# Patient Record
Sex: Female | Born: 1980 | Race: Black or African American | Hispanic: No | Marital: Married | Smoking: Never smoker
Health system: Southern US, Community
[De-identification: ages and names within clinical notes are randomized; demographics above are authoritative.]

## PROBLEM LIST (undated history)

## (undated) ENCOUNTER — Inpatient Hospital Stay (HOSPITAL_COMMUNITY): Payer: Self-pay

## (undated) DIAGNOSIS — I890 Lymphedema, not elsewhere classified: Secondary | ICD-10-CM

## (undated) DIAGNOSIS — O139 Gestational [pregnancy-induced] hypertension without significant proteinuria, unspecified trimester: Secondary | ICD-10-CM

## (undated) DIAGNOSIS — I10 Essential (primary) hypertension: Secondary | ICD-10-CM

## (undated) DIAGNOSIS — C50919 Malignant neoplasm of unspecified site of unspecified female breast: Secondary | ICD-10-CM

## (undated) HISTORY — PX: BREAST SURGERY: SHX581

---

## 2017-05-25 ENCOUNTER — Inpatient Hospital Stay (HOSPITAL_COMMUNITY)
Admission: AD | Admit: 2017-05-25 | Discharge: 2017-05-25 | Disposition: A | Discharge status: Home / Self Care | Payer: Self-pay | Source: Ambulatory Visit | Attending: Obstetrics & Gynecology | Admitting: Obstetrics & Gynecology

## 2017-05-25 ENCOUNTER — Other Ambulatory Visit: Payer: Self-pay

## 2017-05-25 ENCOUNTER — Encounter (HOSPITAL_COMMUNITY): Payer: Self-pay | Admitting: *Deleted

## 2017-05-25 DIAGNOSIS — O10013 Pre-existing essential hypertension complicating pregnancy, third trimester: Secondary | ICD-10-CM | POA: Insufficient documentation

## 2017-05-25 DIAGNOSIS — R42 Dizziness and giddiness: Secondary | ICD-10-CM | POA: Insufficient documentation

## 2017-05-25 DIAGNOSIS — O10913 Unspecified pre-existing hypertension complicating pregnancy, third trimester: Secondary | ICD-10-CM

## 2017-05-25 DIAGNOSIS — Z3A32 32 weeks gestation of pregnancy: Secondary | ICD-10-CM | POA: Insufficient documentation

## 2017-05-25 HISTORY — DX: Essential (primary) hypertension: I10

## 2017-05-25 HISTORY — DX: Malignant neoplasm of unspecified site of unspecified female breast: C50.919

## 2017-05-25 LAB — CBC WITH DIFFERENTIAL/PLATELET
BASOS ABS: 0 10*3/uL (ref 0.0–0.1)
BASOS PCT: 0 %
EOS ABS: 0.2 10*3/uL (ref 0.0–0.7)
Eosinophils Relative: 3 %
HEMATOCRIT: 31.7 % — AB (ref 36.0–46.0)
HEMOGLOBIN: 10.9 g/dL — AB (ref 12.0–15.0)
Lymphocytes Relative: 40 %
Lymphs Abs: 3.1 10*3/uL (ref 0.7–4.0)
MCH: 27.9 pg (ref 26.0–34.0)
MCHC: 34.4 g/dL (ref 30.0–36.0)
MCV: 81.3 fL (ref 78.0–100.0)
MONO ABS: 0.3 10*3/uL (ref 0.1–1.0)
Monocytes Relative: 4 %
NEUTROS ABS: 4 10*3/uL (ref 1.7–7.7)
NEUTROS PCT: 53 %
Platelets: 232 10*3/uL (ref 150–400)
RBC: 3.9 MIL/uL (ref 3.87–5.11)
RDW: 13.6 % (ref 11.5–15.5)
WBC: 7.6 10*3/uL (ref 4.0–10.5)

## 2017-05-25 LAB — COMPREHENSIVE METABOLIC PANEL
ALBUMIN: 3.3 g/dL — AB (ref 3.5–5.0)
ALT: 12 U/L — ABNORMAL LOW (ref 14–54)
ANION GAP: 10 (ref 5–15)
AST: 15 U/L (ref 15–41)
Alkaline Phosphatase: 88 U/L (ref 38–126)
BILIRUBIN TOTAL: 0.3 mg/dL (ref 0.3–1.2)
BUN: 6 mg/dL (ref 6–20)
CO2: 21 mmol/L — AB (ref 22–32)
Calcium: 8.9 mg/dL (ref 8.9–10.3)
Chloride: 102 mmol/L (ref 101–111)
Creatinine, Ser: 0.5 mg/dL (ref 0.44–1.00)
GFR calc Af Amer: >60 mL/min (ref >60)
GFR calc non Af Amer: >60 mL/min (ref >60)
GLUCOSE: 81 mg/dL (ref 65–99)
POTASSIUM: 3.7 mmol/L (ref 3.5–5.1)
SODIUM: 133 mmol/L — AB (ref 135–145)
TOTAL PROTEIN: 7 g/dL (ref 6.5–8.1)

## 2017-05-25 LAB — URINALYSIS, ROUTINE W REFLEX MICROSCOPIC
BILIRUBIN URINE: NEGATIVE
Glucose, UA: NEGATIVE mg/dL
Hgb urine dipstick: NEGATIVE
KETONES UR: NEGATIVE mg/dL
LEUKOCYTES UA: NEGATIVE
NITRITE: NEGATIVE
PROTEIN: NEGATIVE mg/dL
Specific Gravity, Urine: 1.009 (ref 1.005–1.030)
pH: 7 (ref 5.0–8.0)

## 2017-05-25 LAB — FETAL FIBRONECTIN: FETAL FIBRONECTIN: NEGATIVE

## 2017-05-25 LAB — WET PREP, GENITAL
CLUE CELLS WET PREP: NONE SEEN
Sperm: NONE SEEN
TRICH WET PREP: NONE SEEN
YEAST WET PREP: NONE SEEN

## 2017-05-25 LAB — PROTEIN / CREATININE RATIO, URINE
Creatinine, Urine: 59 mg/dL
Total Protein, Urine: <6 mg/dL

## 2017-05-25 MED ORDER — NIFEDIPINE 10 MG PO CAPS
20.0000 mg | ORAL_CAPSULE | Freq: Once | ORAL | Status: AC
  Administered 2017-05-25: 20 mg via ORAL
  Filled 2017-05-25: qty 2

## 2017-05-25 MED ORDER — LABETALOL HCL 100 MG PO TABS: 100.0000 mg | ORAL_TABLET | Freq: Two times a day (BID) | ORAL | 2 refills | Status: DC

## 2017-05-25 NOTE — MAU Provider Note (Signed)
History     CSN: [id number]  Arrival date and time: 05/25/17 1206   First Provider Initiated Contact with Patient 05/25/17 1332      Chief Complaint  Patient presents with  . Abdominal Pain  . Dizziness   HPI   [name] is a 36 y.o. female G3P1011 @ 28w4dhere in MAU with complaints of dizziness. [place]. States she woke up feeling dizzy today. The dizziness occurs when she is standing. She did eat breakfast: bread and butter and coffee. + fetal movement. +nausea, with vomiting. No vomiting today. Has chronic HTN and is taking aldomet. She started this in August after having an elevated BP in the office. States she doesn't take it everyday. She is supposed to take it three times per day. The last time she took her BP medication was 1 month ago.   OB History    Gravida Para Term Preterm AB Living   3 1 1   1 1    SAB TAB Ectopic Multiple Live Births           1      Past Medical History:  Diagnosis Date  . Breast cancer ([facility]     Past Surgical History:  Procedure Laterality Date  . BREAST SURGERY      History reviewed. No pertinent family history.  Social History   Tobacco Use  . Smoking status: Never Smoker  . Smokeless tobacco: Never Used  Substance Use Topics  . Alcohol use: No    Frequency: Never  . Drug use: No    Allergies: No Known Allergies  No medications prior to admission.   Results for orders placed or performed during the hospital encounter of 05/25/17 (from the past 48 hour(s))  Urinalysis, Routine w reflex microscopic     Status: Abnormal   Collection Time: 05/25/17 12:25 PM  Result Value Ref Range   Color, Urine STRAW (A) YELLOW   APPearance CLEAR CLEAR   Specific Gravity, Urine 1.009 1.005 - 1.030   pH 7.0 5.0 - 8.0   Glucose, UA NEGATIVE NEGATIVE mg/dL   Hgb urine dipstick NEGATIVE NEGATIVE   Bilirubin Urine NEGATIVE NEGATIVE   Ketones, ur NEGATIVE  NEGATIVE mg/dL   Protein, ur NEGATIVE NEGATIVE mg/dL   Nitrite NEGATIVE NEGATIVE   Leukocytes, UA NEGATIVE NEGATIVE  Protein / creatinine ratio, urine     Status: None   Collection Time: 05/25/17 12:25 PM  Result Value Ref Range   Creatinine, Urine 59.00 mg/dL   Total Protein, Urine <6 mg/dL    Comment: REPEATED TO VERIFY   Protein Creatinine Ratio        0.00 - 0.15 mg/mg[Cre]    Comment: RESULT BELOW REPORTABLE RANGE, UNABLE TO CALCULATE.   CBC with Differential/Platelet     Status: Abnormal   Collection Time: 05/25/17  1:15 PM  Result Value Ref Range   WBC 7.6 4.0 - 10.5 K/uL   RBC 3.90 3.87 - 5.11 MIL/uL   Hemoglobin 10.9 (L) 12.0 - 15.0 g/dL   HCT 31.7 (L) 36.0 - 46.0 %   MCV 81.3 78.0 - 100.0 fL   MCH 27.9 26.0 - 34.0 pg   MCHC 34.4 30.0 - 36.0 g/dL   RDW 13.6 11.5 - 15.5 %   Platelets 232 150 - 400 K/uL   Neutrophils Relative % 53 %   Neutro Abs 4.0 1.7 - 7.7 K/uL   Lymphocytes Relative 40 %  Lymphs Abs 3.1 0.7 - 4.0 K/uL   Monocytes Relative 4 %   Monocytes Absolute 0.3 0.1 - 1.0 K/uL   Eosinophils Relative 3 %   Eosinophils Absolute 0.2 0.0 - 0.7 K/uL   Basophils Relative 0 %   Basophils Absolute 0.0 0.0 - 0.1 K/uL  Comprehensive metabolic panel     Status: Abnormal   Collection Time: 05/25/17  1:15 PM  Result Value Ref Range   Sodium 133 (L) 135 - 145 mmol/L   Potassium 3.7 3.5 - 5.1 mmol/L   Chloride 102 101 - 111 mmol/L   CO2 21 (L) 22 - 32 mmol/L   Glucose, Bld 81 65 - 99 mg/dL   BUN 6 6 - 20 mg/dL   Creatinine, Ser 0.50 0.44 - 1.00 mg/dL   Calcium 8.9 8.9 - 10.3 mg/dL   Total Protein 7.0 6.5 - 8.1 g/dL   Albumin 3.3 (L) 3.5 - 5.0 g/dL   AST 15 15 - 41 U/L   ALT 12 (L) 14 - 54 U/L   Alkaline Phosphatase 88 38 - 126 U/L   Total Bilirubin 0.3 0.3 - 1.2 mg/dL   GFR calc non Af Amer >60 >60 mL/min   GFR calc Af Amer >60 >60 mL/min    Comment: (NOTE) The eGFR has been calculated using the CKD EPI equation. This calculation has not been validated in  all clinical situations. eGFR's persistently <60 mL/min signify possible Chronic Kidney Disease.    Anion gap 10 5 - 15  Fetal fibronectin     Status: None   Collection Time: 05/25/17  2:07 PM  Result Value Ref Range   Fetal Fibronectin NEGATIVE NEGATIVE  Wet prep, genital     Status: Abnormal   Collection Time: 05/25/17  2:07 PM  Result Value Ref Range   Yeast Wet Prep HPF POC NONE SEEN NONE SEEN   Trich, Wet Prep NONE SEEN NONE SEEN   Clue Cells Wet Prep HPF POC NONE SEEN NONE SEEN   WBC, Wet Prep HPF POC FEW (A) NONE SEEN    Comment: MANY BACTERIA SEEN   Sperm NONE SEEN    Review of Systems  Eyes: Negative for photophobia and visual disturbance.  Neurological: Negative for headaches.   Physical Exam   Blood pressure 136/70, pulse 90, temperature 98.3 F (36.8 C), temperature source Oral, resp. rate 16, weight 213 lb 8 oz (96.8 kg), last menstrual period 10/09/2016, SpO2 99 %.  Physical Exam  Constitutional: She is oriented to person, place, and time. She appears well-developed and well-nourished. No distress.  HENT:  Head: Normocephalic.  Eyes: Pupils are equal, round, and reactive to light.  Respiratory: Effort normal.  GI: Soft. She exhibits no distension. There is no tenderness. There is no rebound.  Genitourinary:  Genitourinary Comments: Dilation: 1 Effacement (%): 50 Exam by:: Noni Saupe, NP  Musculoskeletal: Normal range of motion.  Neurological: She is alert and oriented to person, place, and time. She has normal reflexes.  Negative clonus  Skin: Skin is warm. She is not diaphoretic.  Psychiatric: Her behavior is normal.   Fetal Tracing: Baseline: 125 bpm Variability: Moderate  Accelerations: 15x15 Decelerations: None Toco: occasional   MAU Course  Procedures  None  MDM  Procardia 20 mg given x 1 Discussed with Dr. Ernestina Patches.  Orthostatic vitals normal   Assessment and Plan   A:  1. Maternal chronic hypertension in third trimester   2.  Dizzinesses     P:  Discharge home with  strict return precautions RX: Labetalol 100 mg BID  Return to the Office on Friday for a BP check Preeclampsia precautions  Return to MAU if symptoms worsen  Message sent to the office to schedule New OB appointment Increase oral fluid Small, frequent meals   Rasch, Artist Pais, NP 05/25/2017 7:51 PM

## 2017-05-25 NOTE — Discharge Instructions (Signed)
For assistance with applying for medicaid please call Reyna [facility] [phone] or Juanita McCraw [phone].  

## 2017-05-25 NOTE — MAU Note (Signed)
Feeling dizzy, (sometimes).  Has a big pain in her abd, RUQ and lower abd.  Arrived [place] the states 12/7, was getting care in [place], last seen 12/1

## 2017-05-26 LAB — GC/CHLAMYDIA PROBE AMP (~~LOC~~) NOT AT ARMC
Chlamydia: NEGATIVE
Neisseria Gonorrhea: NEGATIVE

## 2017-05-27 ENCOUNTER — Inpatient Hospital Stay (HOSPITAL_COMMUNITY)
Admission: AD | Admit: 2017-05-27 | Discharge: 2017-05-27 | Disposition: A | Discharge status: Home / Self Care | Payer: Self-pay | Source: Ambulatory Visit | Attending: Obstetrics & Gynecology | Admitting: Obstetrics & Gynecology

## 2017-05-27 ENCOUNTER — Ambulatory Visit: Payer: Self-pay

## 2017-05-27 ENCOUNTER — Other Ambulatory Visit: Payer: Self-pay

## 2017-05-27 DIAGNOSIS — Z79899 Other long term (current) drug therapy: Secondary | ICD-10-CM | POA: Insufficient documentation

## 2017-05-27 DIAGNOSIS — Z3A32 32 weeks gestation of pregnancy: Secondary | ICD-10-CM | POA: Insufficient documentation

## 2017-05-27 DIAGNOSIS — O10919 Unspecified pre-existing hypertension complicating pregnancy, unspecified trimester: Secondary | ICD-10-CM

## 2017-05-27 DIAGNOSIS — O10913 Unspecified pre-existing hypertension complicating pregnancy, third trimester: Secondary | ICD-10-CM

## 2017-05-27 DIAGNOSIS — Z0130 Encounter for examination of blood pressure without abnormal findings: Secondary | ICD-10-CM

## 2017-05-27 DIAGNOSIS — O163 Unspecified maternal hypertension, third trimester: Secondary | ICD-10-CM | POA: Insufficient documentation

## 2017-05-27 MED ORDER — LABETALOL HCL 200 MG PO TABS: 200.0000 mg | ORAL_TABLET | Freq: Three times a day (TID) | ORAL | 1 refills | Status: DC

## 2017-05-27 NOTE — MAU Note (Addendum)
Pt here for BP check, states she started taking labetalol two days ago.  Pt states she has some lower abd pain which has been ongoing. Denies HA or visual changes.  Denies bleeding or LOF.  Reports good fetal movement.

## 2017-05-27 NOTE — MAU Provider Note (Signed)
[name] is a 36 y.o. female G35P1011 @ [redacted]w[redacted]d with CHTN,  here in MAU for a BP check. She was seen in MAU on Wednesday.  She was started on labetalol 100 mg BID on Wednesday and says she has been taking it at prescribed. She denies HA or changes in her vision. She was scheduled to be seen in the [place] this morning however was not able to make it.  + fetal movement.   Patient Vitals for the past 24 hrs:  BP Temp Temp src Pulse Resp  05/27/17 1754 (!) 147/95 - - (!) 103 20  05/27/17 1731 (!) 144/84 98.3 F (36.8 C) Oral (!) 105 18    MDM:  Discussed BP readings with Dr. Elonda Husky, patient is without complaints.. Pre-E labs on 12/19 WNL   A:  BP check  Chronic hypertension during pregnancy, antepartum   P:  Discharge home with strict return precautions Increase Labetalol to 200 mg TID Return to MAU on Monday for a BP check, office is closed Preeclampsia precautions    Norman Piacentini, Artist Pais, NP 05/27/2017 6:59 PM

## 2017-05-27 NOTE — Discharge Instructions (Signed)

## 2017-05-31 ENCOUNTER — Inpatient Hospital Stay (HOSPITAL_COMMUNITY)
Admission: AD | Admit: 2017-05-31 | Discharge: 2017-05-31 | Disposition: A | Discharge status: Home / Self Care | Payer: Self-pay | Source: Ambulatory Visit | Attending: Family Medicine | Admitting: Family Medicine

## 2017-05-31 DIAGNOSIS — Z79899 Other long term (current) drug therapy: Secondary | ICD-10-CM | POA: Insufficient documentation

## 2017-05-31 DIAGNOSIS — O169 Unspecified maternal hypertension, unspecified trimester: Secondary | ICD-10-CM

## 2017-05-31 DIAGNOSIS — Z0130 Encounter for examination of blood pressure without abnormal findings: Secondary | ICD-10-CM | POA: Insufficient documentation

## 2017-05-31 DIAGNOSIS — O163 Unspecified maternal hypertension, third trimester: Secondary | ICD-10-CM

## 2017-05-31 NOTE — MAU Provider Note (Signed)
Whitney Morrison is a 36 y.o. G3P1011 @ [redacted]w[redacted]d gestation who present to the MAU for BP check after BP medication adjustment a few days ago. Patient denies headache, visual changes, abdominal pain or any problems.  BP 130/78   Pulse 97   Temp 98.4 F (36.9 C)   Resp 18   Wt 214 lb (97.1 kg)   LMP 10/09/2016   SpO2 100%   Positive doppler FHT's.  Patient to continue Labetalol and f/u as scheduled in the [facility]. Return precautions discussed with the patient.  Patient appears stable for d/c with normal BP. Medical screening exam complete.

## 2017-05-31 NOTE — MAU Note (Signed)
Pt here for BP check. Was seen in MAU and told to come Monday for recheck.

## 2017-05-31 NOTE — Discharge Instructions (Signed)
Hypertension During Pregnancy °Hypertension, commonly called high blood pressure, is when the force of blood pumping through your arteries is too strong. Arteries are blood vessels that carry blood from the heart throughout the body. Hypertension during pregnancy can cause problems for you and your baby. Your baby may be born early (prematurely) or may not weigh as much as he or she should at birth. Very bad cases of hypertension during pregnancy can be life-threatening. °Different types of hypertension can occur during pregnancy. These include: °· Chronic hypertension. This happens when: °? You have hypertension before pregnancy and it continues during pregnancy. °? You develop hypertension before you are [redacted] weeks pregnant, and it continues during pregnancy. °· Gestational hypertension. This is hypertension that develops after the 20th week of pregnancy. °· Preeclampsia, also called toxemia of pregnancy. This is a very serious type of hypertension that develops only during pregnancy. It affects the whole body, and it can be very dangerous for you and your baby. ° °Gestational hypertension and preeclampsia usually go away within 6 weeks after your baby is born. Women who have hypertension during pregnancy have a greater chance of developing hypertension later in life or during future pregnancies. °What are the causes? °The exact cause of hypertension is not known. °What increases the risk? °There are certain factors that make it more likely for you to develop hypertension during pregnancy. These include: °· Having hypertension during a previous pregnancy or prior to pregnancy. °· Being overweight. °· Being older than age 40. °· Being pregnant for the first time or being pregnant with more than one baby. °· Becoming pregnant using fertilization methods such as IVF (in vitro fertilization). °· Having diabetes, kidney problems, or systemic lupus erythematosus. °· Having a family history of hypertension. ° °What are the  signs or symptoms? °Chronic hypertension and gestational hypertension rarely cause symptoms. Preeclampsia causes symptoms, which may include: °· Increased protein in your urine. Your health care provider will check for this at every visit before you give birth (prenatal visit). °· Severe headaches. °· Sudden weight gain. °· Swelling of the hands, face, legs, and feet. °· Nausea and vomiting. °· Vision problems, such as blurred or double vision. °· Numbness in the face, arms, legs, and feet. °· Dizziness. °· Slurred speech. °· Sensitivity to bright lights. °· Abdominal pain. °· Convulsions. ° °How is this diagnosed? °You may be diagnosed with hypertension during a routine prenatal exam. At each prenatal visit, you may: °· Have a urine test to check for high amounts of protein in your urine. °· Have your blood pressure checked. A blood pressure reading is recorded as two numbers, such as "120 over 80" (or 120/80). The first ("top") number is called the systolic pressure. It is a measure of the pressure in your arteries when your heart beats. The second ("bottom") number is called the diastolic pressure. It is a measure of the pressure in your arteries as your heart relaxes between beats. Blood pressure is measured in a unit called mm Hg. A normal blood pressure reading is: °? Systolic: below 120. °? Diastolic: below 80. ° °The type of hypertension that you are diagnosed with depends on your test results and when your symptoms developed. °· Chronic hypertension is usually diagnosed before 20 weeks of pregnancy. °· Gestational hypertension is usually diagnosed after 20 weeks of pregnancy. °· Hypertension with high amounts of protein in the urine is diagnosed as preeclampsia. °· Blood pressure measurements that stay above 160 systolic, or above 110 diastolic, are   signs of severe preeclampsia. ° °How is this treated? °Treatment for hypertension during pregnancy varies depending on the type of hypertension you have and how  serious it is. °· If you take medicines called ACE inhibitors to treat chronic hypertension, you may need to switch medicines. ACE inhibitors should not be taken during pregnancy. °· If you have gestational hypertension, you may need to take blood pressure medicine. °· If you are at risk for preeclampsia, your health care provider may recommend that you take a low-dose aspirin every day to prevent high blood pressure during your pregnancy. °· If you have severe preeclampsia, you may need to be hospitalized so you and your baby can be monitored closely. You may also need to take medicine (magnesium sulfate) to prevent seizures and to lower blood pressure. This medicine may be given as an injection or through an IV tube. °· In some cases, if your condition gets worse, you may need to deliver your baby early. ° °Follow these instructions at home: °Eating and drinking °· Drink enough fluid to keep your urine clear or pale yellow. °· Eat a healthy diet that is low in salt (sodium). Do not add salt to your food. Check food labels to see how much sodium a food or beverage contains. °Lifestyle °· Do not use any products that contain nicotine or tobacco, such as cigarettes and e-cigarettes. If you need help quitting, ask your health care provider. °· Do not use alcohol. °· Avoid caffeine. °· Avoid stress as much as possible. Rest and get plenty of sleep. °General instructions °· Take over-the-counter and prescription medicines only as told by your health care provider. °· While lying down, lie on your left side. This keeps pressure off your baby. °· While sitting or lying down, raise (elevate) your feet. Try putting some pillows under your lower legs. °· Exercise regularly. Ask your health care provider what kinds of exercise are best for you. °· Keep all prenatal and follow-up visits as told by your health care provider. This is important. °Contact a health care provider if: °· You have symptoms that your health care  provider told you may require more treatment or monitoring, such as: °? Fever. °? Vomiting. °? Headache. °Get help right away if: °· You have severe abdominal pain or vomiting that does not get better with treatment. °· You suddenly develop swelling in your hands, ankles, or face. °· You gain 4 lbs (1.8 kg) or more in 1 week. °· You develop vaginal bleeding, or you have blood in your urine. °· You do not feel your baby moving as much as usual. °· You have blurred or double vision. °· You have muscle twitching or sudden tightening (spasms). °· You have shortness of breath. °· Your lips or fingernails turn blue. °This information is not intended to replace advice given to you by your health care provider. Make sure you discuss any questions you have with your health care provider. °Document Released: 02/09/2011 Document Revised: 12/12/2015 Document Reviewed: 11/07/2015 °Elsevier Interactive Patient Education © 2018 [employer] ° °

## 2017-06-07 NOTE — L&D Delivery Note (Signed)
Delivery Note At 9:47 AM a viable female was delivered via Vaginal, Spontaneous (Presentation:vertex ;LOA  ).  APGAR: 9, 9; weight pending    Placenta status:delivered intact  , .  Cord:3 V with marginal insertion not previously noted   with the following complications:none .  Cord pH: N/A  Anesthesia:  epidural Episiotomy: None Lacerations:  none Suture Repair: none Est. Blood Loss (mL):  0  Mom to postpartum.  Baby to Couplet care / Skin to Skin.  Normalee Sistare 07/11/2017, 10:03 AM

## 2017-06-14 ENCOUNTER — Encounter (HOSPITAL_COMMUNITY): Payer: Self-pay | Admitting: *Deleted

## 2017-06-14 ENCOUNTER — Encounter (HOSPITAL_COMMUNITY): Payer: Self-pay | Admitting: Student

## 2017-06-14 ENCOUNTER — Other Ambulatory Visit: Payer: Self-pay

## 2017-06-14 ENCOUNTER — Encounter: Payer: Self-pay | Admitting: Obstetrics and Gynecology

## 2017-06-14 ENCOUNTER — Inpatient Hospital Stay (HOSPITAL_COMMUNITY)
Admission: AD | Admit: 2017-06-14 | Discharge: 2017-06-14 | Disposition: A | Discharge status: Home / Self Care | Payer: Self-pay | Source: Ambulatory Visit | Attending: Obstetrics and Gynecology | Admitting: Obstetrics and Gynecology

## 2017-06-14 DIAGNOSIS — Z853 Personal history of malignant neoplasm of breast: Secondary | ICD-10-CM | POA: Insufficient documentation

## 2017-06-14 DIAGNOSIS — Z833 Family history of diabetes mellitus: Secondary | ICD-10-CM | POA: Insufficient documentation

## 2017-06-14 DIAGNOSIS — O09523 Supervision of elderly multigravida, third trimester: Secondary | ICD-10-CM | POA: Insufficient documentation

## 2017-06-14 DIAGNOSIS — Z363 Encounter for antenatal screening for malformations: Secondary | ICD-10-CM

## 2017-06-14 DIAGNOSIS — O10013 Pre-existing essential hypertension complicating pregnancy, third trimester: Secondary | ICD-10-CM | POA: Insufficient documentation

## 2017-06-14 DIAGNOSIS — O10913 Unspecified pre-existing hypertension complicating pregnancy, third trimester: Secondary | ICD-10-CM

## 2017-06-14 DIAGNOSIS — O0933 Supervision of pregnancy with insufficient antenatal care, third trimester: Secondary | ICD-10-CM | POA: Insufficient documentation

## 2017-06-14 DIAGNOSIS — Z3A35 35 weeks gestation of pregnancy: Secondary | ICD-10-CM | POA: Insufficient documentation

## 2017-06-14 HISTORY — DX: Gestational (pregnancy-induced) hypertension without significant proteinuria, unspecified trimester: O13.9

## 2017-06-14 LAB — URINALYSIS, ROUTINE W REFLEX MICROSCOPIC
BILIRUBIN URINE: NEGATIVE
Glucose, UA: NEGATIVE mg/dL
HGB URINE DIPSTICK: NEGATIVE
Ketones, ur: NEGATIVE mg/dL
Leukocytes, UA: NEGATIVE
Nitrite: NEGATIVE
Protein, ur: NEGATIVE mg/dL
SPECIFIC GRAVITY, URINE: 1.023 (ref 1.005–1.030)
pH: 5 (ref 5.0–8.0)

## 2017-06-14 NOTE — Discharge Instructions (Signed)

## 2017-06-14 NOTE — MAU Note (Signed)
Pt presents with c/o dizziness that began a few minutes ago, lower back pain.  Pt states had appt todau in clinic, not seen d/t late for appt.  Pt reports hx of ^BP with this pregnacy, taking Labetalol 400mg  tid.  Wants to be seen.
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
## 2017-06-14 NOTE — MAU Provider Note (Signed)
History     CSN: [id number]  Arrival date and time: 06/14/17 1112   First Provider Initiated Contact with Patient 06/14/17 1236      Chief Complaint  Patient presents with  . Hypertension   HPI Whitney Morrison is a 37 y.o. G3P1101 at [redacted]w[redacted]d who presents for evaluation. Patient has not been able to keep initial OB appointment & has not had any prenatal care with this pregnancy. Patient with history of chronic hypertension & is currently taking labetalol 200 mg BID. Was rescheduled for her OB appointment this morning but arrived late & was unable to be seen. Currently denies headache, visual disturbance, epigastric pain, abdominal pain, or vaginal bleeding. Positive fetal movement.   Past Medical History:  Diagnosis Date  . Breast cancer ([facility])   . Hypertension   . Pregnancy induced hypertension    during her 2005 pregnancy and ever since    Past Surgical History:  Procedure Laterality Date  . BREAST SURGERY      Family History  Problem Relation Age of Onset  . Diabetes Father   . Hypertension Maternal Grandmother     Social History   Tobacco Use  . Smoking status: Never Smoker  . Smokeless tobacco: Never Used  Substance Use Topics  . Alcohol use: No    Frequency: Never  . Drug use: No    Allergies: No Known Allergies  Medications Prior to Admission  Medication Sig Dispense Refill Last Dose  . acetaminophen (TYLENOL) 325 MG tablet Take 650 mg by mouth every 6 (six) hours as needed for mild pain or headache.   Past Month at Unknown time  . calcium carbonate (TUMS - DOSED IN MG ELEMENTAL CALCIUM) 500 MG chewable tablet Chew 1 tablet by mouth daily as needed for indigestion or heartburn.   06/13/2017 at Unknown time  . labetalol (NORMODYNE) 200 MG tablet Take 1 tablet (200 mg total) by mouth 3 (three) times daily. 180 tablet 1 06/14/2017 at 1000  . prenatal vitamin w/FE, FA (PRENATAL 1 + 1) 27-1 MG TABS tablet Take 1 tablet by mouth daily at 12 noon.   Past Week at Unknown time     Review of Systems  Constitutional: Negative.   Gastrointestinal: Negative.   Genitourinary: Negative.    Physical Exam   Blood pressure 106/75, pulse 89, temperature 97.7 F (36.5 C), temperature source Oral, resp. rate 18, height 5' 5.75" (1.67 m), weight 215 lb 4 oz (97.6 kg), last menstrual period 10/09/2016, SpO2 100 %.  Physical Exam  Nursing note and vitals reviewed. Constitutional: She is oriented to person, place, and time. She appears well-developed and well-nourished. No distress.  HENT:  Head: Normocephalic and atraumatic.  Eyes: Conjunctivae are normal. Right eye exhibits no discharge. Left eye exhibits no discharge. No scleral icterus.  Neck: Normal range of motion.  Cardiovascular: Normal rate, regular rhythm and normal heart sounds.  No murmur heard. Respiratory: Effort normal and breath sounds normal. No respiratory distress. She has no wheezes.  GI: Soft. There is no tenderness.  Neurological: She is alert and oriented to person, place, and time.  Skin: Skin is warm and dry. She is not diaphoretic.  Psychiatric: She has a normal mood and affect. Her behavior is normal. Judgment and thought content normal.    MAU Course  Procedures No results found for this or any previous visit (from the past 24 hour(s)).   MDM NST:  Baseline: 135 bpm, Variability: Good {> 6 bpm), Accelerations: Reactive and Decelerations: Absent  Pt normotensive & without complaint. Has appointment scheduled next week. Will schedule anatomy scan today so will hopefully have results for next visit. Stressed importance of keeping scheduled appointments & need to additional testing in pregnancy d/t chronic hypertension.   Assessment and Plan  A: 1. Maternal chronic hypertension in third trimester   2. [redacted] weeks gestation of pregnancy   3. Encounter for antenatal screening for malformation using ultrasound   4. Limited prenatal care in third trimester    P: Discharge home Keep scheduled  appointments Anatomy ultrasound scheduled for this Friday Discussed reasons to return to [place] 06/14/2017, 12:36 PM

## 2017-06-17 ENCOUNTER — Encounter (HOSPITAL_COMMUNITY): Payer: Self-pay

## 2017-06-17 ENCOUNTER — Ambulatory Visit (HOSPITAL_COMMUNITY)
Admission: RE | Admit: 2017-06-17 | Discharge: 2017-06-17 | Disposition: A | Discharge status: Home / Self Care | Payer: Self-pay | Source: Ambulatory Visit | Attending: Student | Admitting: Student

## 2017-06-17 ENCOUNTER — Other Ambulatory Visit (HOSPITAL_COMMUNITY): Payer: Self-pay | Admitting: Student

## 2017-06-17 DIAGNOSIS — O09523 Supervision of elderly multigravida, third trimester: Secondary | ICD-10-CM | POA: Insufficient documentation

## 2017-06-17 DIAGNOSIS — O09299 Supervision of pregnancy with other poor reproductive or obstetric history, unspecified trimester: Secondary | ICD-10-CM

## 2017-06-17 DIAGNOSIS — Z3A35 35 weeks gestation of pregnancy: Secondary | ICD-10-CM

## 2017-06-17 DIAGNOSIS — O0933 Supervision of pregnancy with insufficient antenatal care, third trimester: Secondary | ICD-10-CM

## 2017-06-17 DIAGNOSIS — Z363 Encounter for antenatal screening for malformations: Secondary | ICD-10-CM | POA: Insufficient documentation

## 2017-06-17 DIAGNOSIS — Z3689 Encounter for other specified antenatal screening: Secondary | ICD-10-CM

## 2017-06-17 DIAGNOSIS — O10913 Unspecified pre-existing hypertension complicating pregnancy, third trimester: Secondary | ICD-10-CM | POA: Insufficient documentation

## 2017-06-17 DIAGNOSIS — Z3A33 33 weeks gestation of pregnancy: Secondary | ICD-10-CM | POA: Insufficient documentation

## 2017-06-17 HISTORY — DX: Lymphedema, not elsewhere classified: I89.0

## 2017-06-17 IMAGING — US US MFM FETAL BPP W/O NON-STRESS
1 series · 13 of 28 positions shown · non-contrast
Comparison: none

[Series 1: us mfm fetal bpp w/o non-stress · 67 acquisitions, 13 frames shown]
[im 3/67]
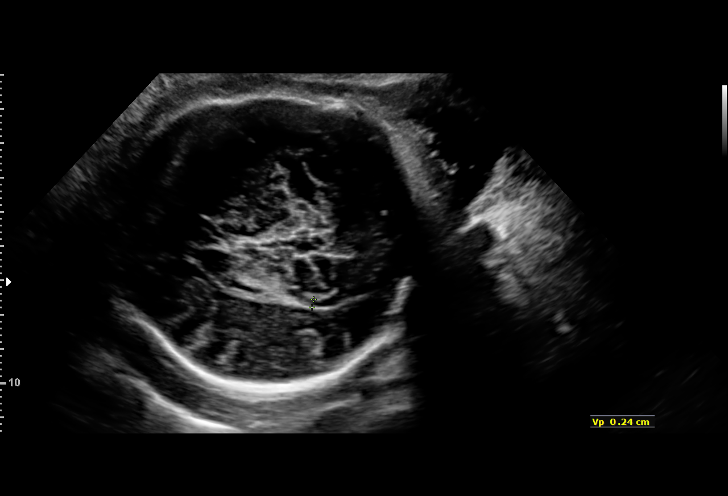
[im 8/67]
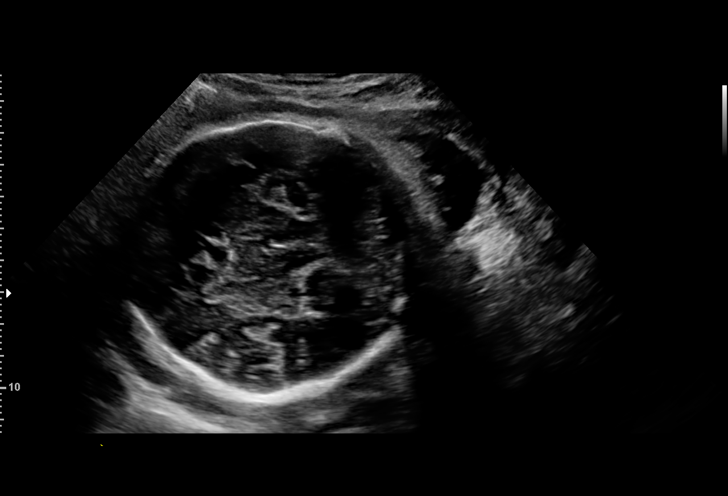
[im 13/67]
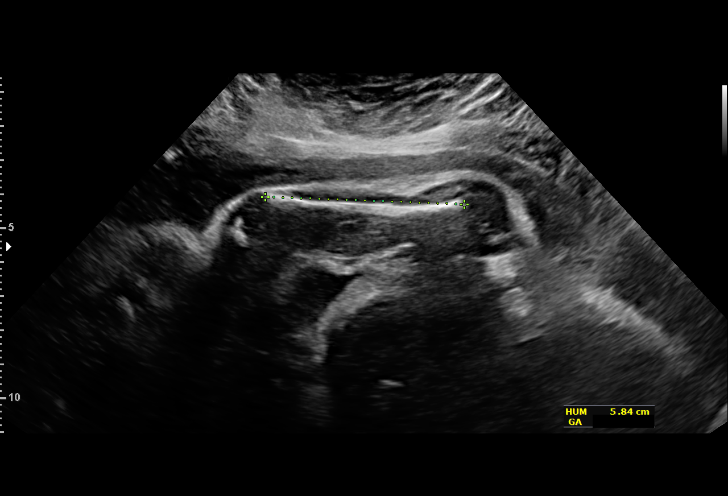
[im 18/67]
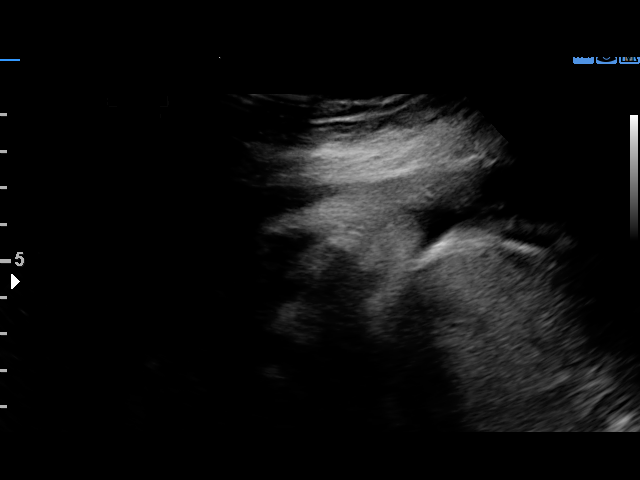
[im 23/67]
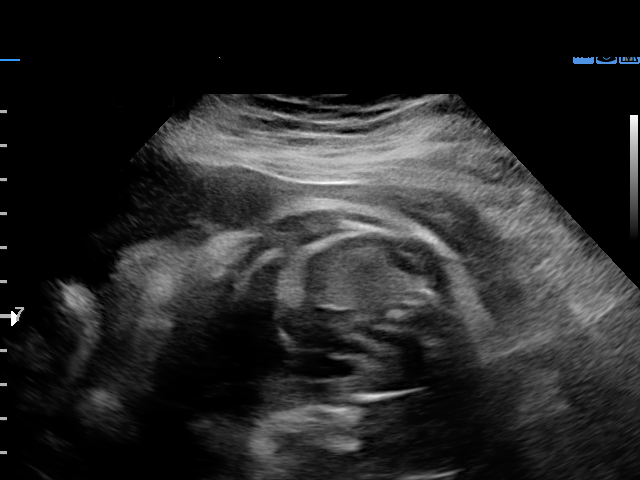
[im 27/67]
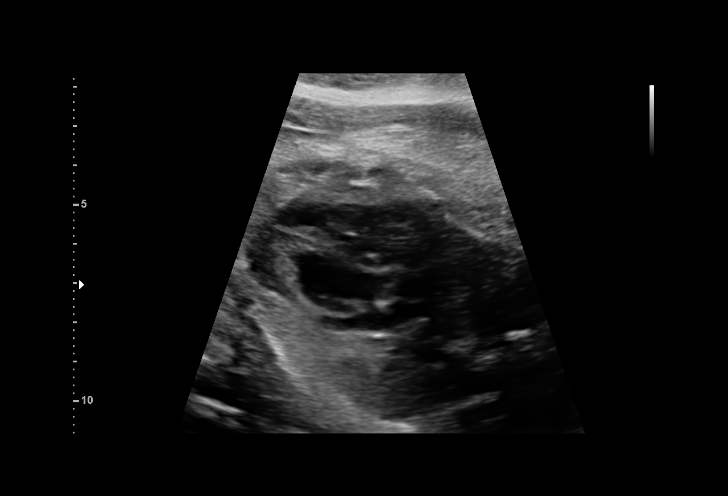
[im 35/67]
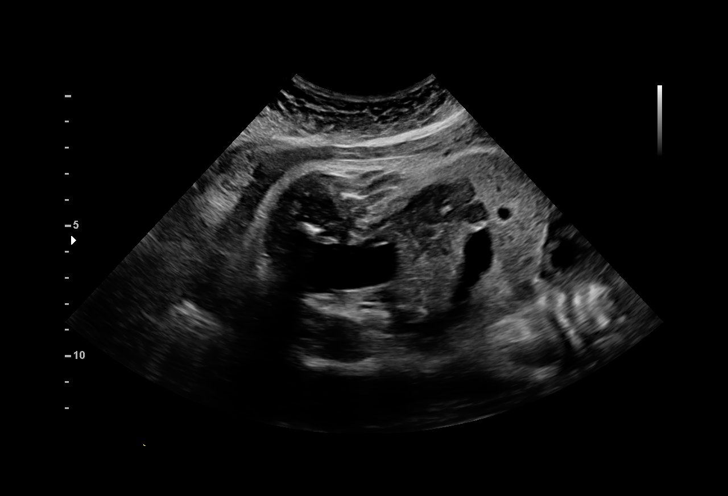
[im 40/67]
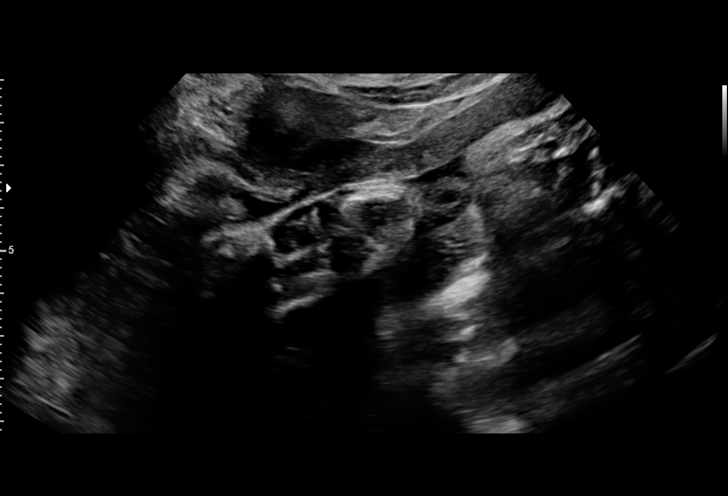
[im 45/67]
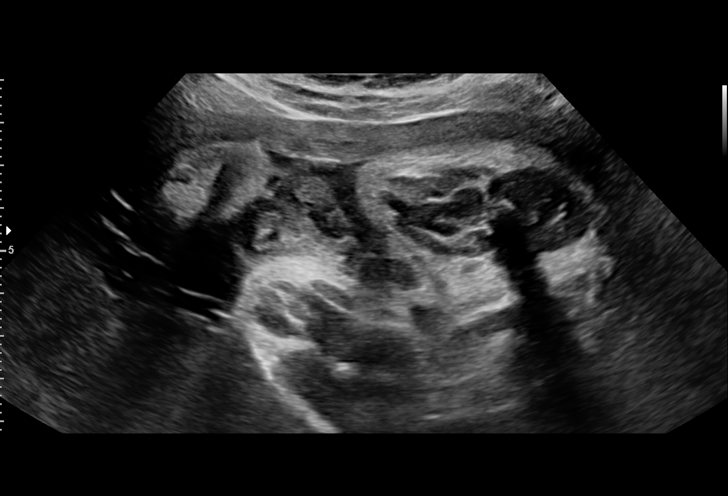
[im 49/67]
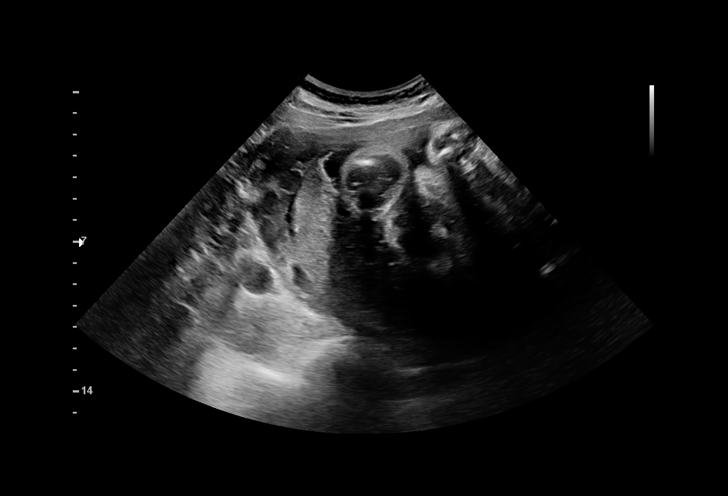
[im 54/67]
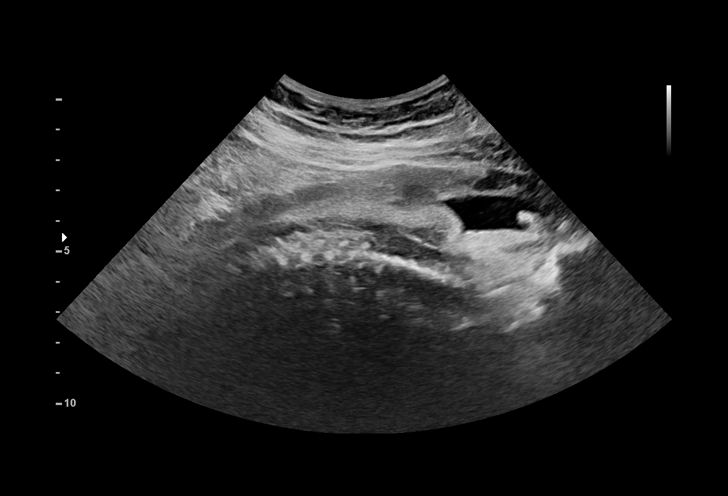
[im 59/67]
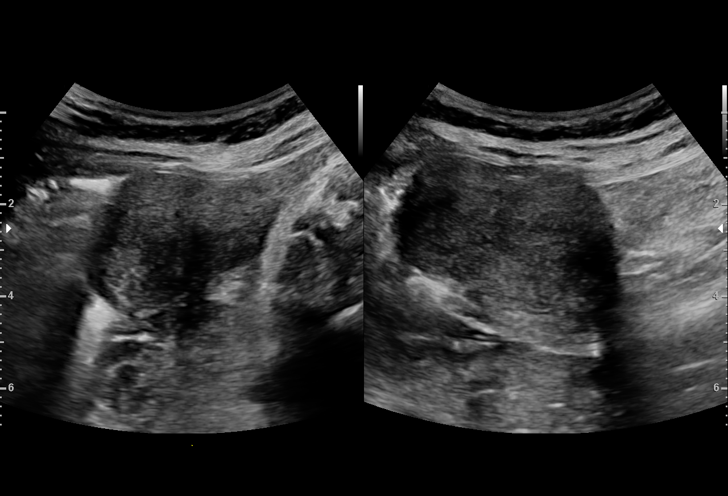
[im 64/67]
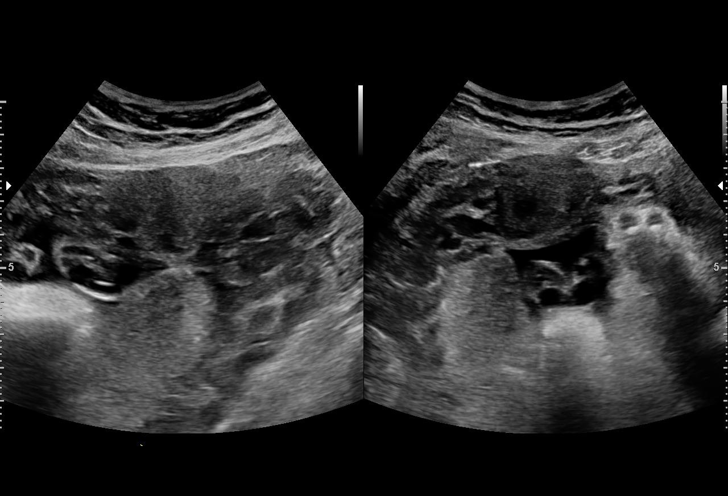

[13 of 28 positions shown; findings below may reference images not displayed]

OB/Gyn Clinic
[REDACTED]

Indications

33 weeks gestation of pregnancy
Advanced maternal age multigravida 35+,        [TE]
third trimester
Hypertension - Chronic/Pre-existing            [TE]
(labetalol)
Poor obstetric history: Previous IUFD          [TE]
(stillbirth)
Late to prenatal care, third trimester         [TE]
Encounter for fetal anatomic survey            [TE]
OB History

Gravidity:    3         Term:   1        Prem:   1
Living:       1
Fetal Evaluation

Num Of Fetuses:     1
Fetal Heart         146
Rate(bpm):
Cardiac Activity:   Observed
Presentation:       Cephalic
Placenta:           Posterior, above cervical os
P. Cord Insertion:  Not well visualized
Amniotic Fluid
AFI FV:      Subjectively within normal limits

AFI Sum(cm)     %Tile       Largest Pocket(cm)
14.38           50

RUQ(cm)       RLQ(cm)       LUQ(cm)        LLQ(cm)
4.58
Biophysical Evaluation

Amniotic F.V:   Pocket => 2 cm two         F. Tone:        Observed
planes
F. Movement:    Observed                   Score:          [DATE]
F. Breathing:   Observed
Biometry

BPD:        83  mm     G. Age:  33w 3d         48  %    CI:        80.29   %    70 - 86
FL/HC:      22.4   %    19.9 -
HC:      292.6  mm     G. Age:  32w 2d          4  %    HC/AC:      0.99        0.96 -
AC:      294.6  mm     G. Age:  33w 3d         56  %    FL/BPD:     78.8   %    71 - 87
FL:       65.4  mm     G. Age:  33w 5d         50  %    FL/AC:      22.2   %    20 - 24
HUM:        58  mm     G. Age:  33w 4d         66  %

Est. FW:    [TE]  gm    4 lb 13 oz      60  %
Gestational Age

LMP:           35w 6d        Date:  [DATE]                 EDD:   [DATE]
U/S Today:     33w 2d                                        EDD:   [DATE]
Best:          33w 2d     Det. By:  U/S ([DATE])           EDD:   [DATE]
Anatomy

Cranium:               Appears normal         Aortic Arch:            Not well visualized
Cavum:                 Not well visualized    Ductal Arch:            Not well visualized
Ventricles:            Appears normal         Diaphragm:              Appears normal
Choroid Plexus:        Not well visualized    Stomach:                Appears normal, left
sided
Cerebellum:            Not well visualized    Abdomen:                Appears normal
Posterior Fossa:       Not well visualized    Abdominal Wall:         Not well visualized
Nuchal Fold:           Not applicable (>20    Cord Vessels:           Appears normal (3
wks GA)                                        vessel cord)
Face:                  Not well visualized    Kidneys:                Appear normal
Lips:                  Appears normal         Bladder:                Appears normal
Thoracic:              Appears normal         Spine:                  Not well visualized
Heart:                 Appears normal         Upper Extremities:      Present
(4CH, axis, and situs
RVOT:                  Appears normal         Lower Extremities:      Present
LVOT:                  Appears normal

Other:  Fetus appears to be a male. Technically difficult due to advanced
gestational age, fetal position and multiple uterine fibroids.
Cervix Uterus Adnexa

Cervix
Not visualized (advanced GA >[TE])
Uterus
Multiple (TNTC) fibroids noted, see table below for largest.

Left Ovary
Not visualized.

Right Ovary
Not visualized.
Myomas

Site                     L(cm)      W(cm)      D(cm)      Location
Anterior mid             3.2        3.7        2.2        Intramural
Fundus right             2.6        4.5        3.2        Subserosal
Right anterior LUS       4.5        2.9        2.1        Intramural

Blood Flow                 RI        PI       Comments

Impression

Single living intrauterine pregnancy at 33w 2d.
Normal detailed fetal anatomy; limited views of CSP, PF,
arches, CI and spine
Normal amniotic fluid volume
EDC based on today's measurements: [DATE]
BPP [DATE]
Recommendations

Continue antenatal testing.
Recommend delivery by 39 weeks if maternal and fetal status
remain reassuring.

## 2017-06-21 ENCOUNTER — Ambulatory Visit (INDEPENDENT_AMBULATORY_CARE_PROVIDER_SITE_OTHER): Payer: Self-pay | Admitting: Family Medicine

## 2017-06-21 ENCOUNTER — Encounter: Payer: Self-pay | Admitting: Family Medicine

## 2017-06-21 VITALS — BP 113/79 | HR 102 | Wt 213.3 lb

## 2017-06-21 DIAGNOSIS — Z23 Encounter for immunization: Secondary | ICD-10-CM

## 2017-06-21 DIAGNOSIS — O0990 Supervision of high risk pregnancy, unspecified, unspecified trimester: Secondary | ICD-10-CM | POA: Insufficient documentation

## 2017-06-21 DIAGNOSIS — Z8759 Personal history of other complications of pregnancy, childbirth and the puerperium: Secondary | ICD-10-CM | POA: Insufficient documentation

## 2017-06-21 DIAGNOSIS — O10913 Unspecified pre-existing hypertension complicating pregnancy, third trimester: Secondary | ICD-10-CM

## 2017-06-21 DIAGNOSIS — Z113 Encounter for screening for infections with a predominantly sexual mode of transmission: Secondary | ICD-10-CM

## 2017-06-21 DIAGNOSIS — Z853 Personal history of malignant neoplasm of breast: Secondary | ICD-10-CM

## 2017-06-21 DIAGNOSIS — O10919 Unspecified pre-existing hypertension complicating pregnancy, unspecified trimester: Secondary | ICD-10-CM

## 2017-06-21 DIAGNOSIS — O0993 Supervision of high risk pregnancy, unspecified, third trimester: Secondary | ICD-10-CM

## 2017-06-21 LAB — POCT URINALYSIS DIP (DEVICE)
BILIRUBIN URINE: NEGATIVE
GLUCOSE, UA: NEGATIVE mg/dL
LEUKOCYTES UA: NEGATIVE
Nitrite: NEGATIVE
PROTEIN: NEGATIVE mg/dL
Urobilinogen, UA: 0.2 mg/dL (ref 0.0–1.0)
pH: 5.5 (ref 5.0–8.0)

## 2017-06-21 NOTE — Progress Notes (Signed)
PRENATAL VISIT NOTE  Subjective:  Whitney Morrison is a 37 y.o. G3P1101 at [redacted]w[redacted]d being seen today for new prenatal visit with [place]. She was previously receiving prenatal care in [place] prior to moving here. She has some records with her, but they are in [place]. She has PMH of chronic HTN, otherwise her pregnancy has been uncomplicated. She was on a BP medication there, and she was seen at [facility] since arrival here, and was stated on labetalol, which she has been taking regularly. She denies any concerns today.  Patient reports no complaints.  Contractions: Irregular. Vag. Bleeding: None.  Movement: Present. Denies leaking of fluid.   The following portions of the patient's history were reviewed and updated as appropriate: allergies, current medications, past family history, past medical history, past social history, past surgical history and problem list. Problem list updated.  OB History  Gravida Para Term Preterm AB Living  3 2 1 1  0 1  SAB TAB Ectopic Multiple Live Births          1    # Outcome Date GA Lbr Len/2nd Weight Sex Delivery Anes PTL Lv  3 Current           2 Preterm         FD  1 Term     M Vag-Spont   LIV    Obstetric Comments  G2: IUFD ~7 months per patient   Past Medical History:  Diagnosis Date  . Breast cancer ([place])   . Hypertension   . Lymph edema   . Pregnancy induced hypertension    during her 2005 pregnancy and ever since   Past Surgical History:  Procedure Laterality Date  . BREAST SURGERY      Objective:   Vitals:   06/21/17 1157  BP: 113/79  Pulse: (!) 102  Weight: 213 lb 4.8 oz (96.8 kg)    Fetal Status: Fetal Heart Rate (bpm): 138   Movement: Present     General:  Alert, oriented and cooperative. Patient is in no acute distress.  Skin: Skin is warm and dry. No rash noted.   Cardiovascular: Normal heart rate noted  Respiratory: Normal respiratory effort, no problems with respiration noted  Abdomen: Soft, gravid, appropriate for gestational age.   Pain/Pressure: Present     Breast: Left breast with surgical scar; no nodules of masses palpated   Pelvic: Cervical exam performed        Extremities: Normal range of motion.  Lymphedema of LUE  Mental Status:  Normal mood and affect. Normal behavior. Normal judgment and thought content.   Assessment and Plan:  Pregnancy: G3P1101 at [redacted]w[redacted]d with Estimated Date of Delivery: 07/16/17 by LMP +1st trimester U/S  1. Supervision of high risk pregnancy, antepartum Initial prenatal labs drawn. She has anatomy ultrasound done last week, showing normal anatomy, normal fluid - Flu Vaccine QUAD 36+ mos IM - GC/Chlamydia probe amp ([place])not at [facility] - Culture, beta strep (group b only) - Obstetric Panel, Including HIV - Hemoglobinopathy Evaluation - Cystic fibrosis gene test - Culture, OB Urine  2. Chronic hypertension during pregnancy, antepartum On labetalol, BP wnl today. Baseline labs drawn in [facility] on 05/25/17, wnl - Continue labtetalol.  - Weekly antenatal testing, and deliver by 39 weeks (BPP 4 days ago in [facility] 8/8).  3. History of breast cancer: in 2010, s/p resection and chemoradiation Per patient, UTD on surveillance, last mammogram 6 months ago - Will need repeat mammogram in 6 monthns  4. History  of IUFD Per patient IUFD ~7 months last pregnancy.  - Needs weekly antenatal testing and delivery by 39 weeks   Preterm labor symptoms and general obstetric precautions including but not limited to vaginal bleeding, contractions, leaking of fluid and fetal movement were reviewed in detail with the patient. Please refer to After Visit Summary for other counseling recommendations.   Return in about 1 week (around 06/28/2017) for [facility] and antenatal testing with Whitney Morrison.   Almyra Free P. Degele, MD OB Fellow  Future Appointments  Date Time Provider [place]  06/28/2017  9:35 AM Lavonia Drafts, MD [place]

## 2017-06-22 LAB — GC/CHLAMYDIA PROBE AMP (~~LOC~~) NOT AT ARMC
Chlamydia: NEGATIVE
NEISSERIA GONORRHEA: NEGATIVE

## 2017-06-24 ENCOUNTER — Encounter: Payer: Self-pay | Admitting: Family Medicine

## 2017-06-24 LAB — URINE CULTURE, OB REFLEX

## 2017-06-24 LAB — CULTURE, OB URINE

## 2017-06-26 ENCOUNTER — Encounter: Payer: Self-pay | Admitting: Family Medicine

## 2017-06-26 DIAGNOSIS — O99820 Streptococcus B carrier state complicating pregnancy: Secondary | ICD-10-CM | POA: Insufficient documentation

## 2017-06-26 LAB — CULTURE, BETA STREP (GROUP B ONLY): STREP GP B CULTURE: POSITIVE — AB

## 2017-06-28 ENCOUNTER — Ambulatory Visit (INDEPENDENT_AMBULATORY_CARE_PROVIDER_SITE_OTHER): Payer: Self-pay | Admitting: Obstetrics & Gynecology

## 2017-06-28 ENCOUNTER — Encounter: Payer: Self-pay | Admitting: Obstetrics & Gynecology

## 2017-06-28 VITALS — BP 131/81 | HR 110 | Wt 218.0 lb

## 2017-06-28 DIAGNOSIS — Z853 Personal history of malignant neoplasm of breast: Secondary | ICD-10-CM

## 2017-06-28 DIAGNOSIS — O99820 Streptococcus B carrier state complicating pregnancy: Secondary | ICD-10-CM

## 2017-06-28 DIAGNOSIS — Z8759 Personal history of other complications of pregnancy, childbirth and the puerperium: Secondary | ICD-10-CM

## 2017-06-28 DIAGNOSIS — O0990 Supervision of high risk pregnancy, unspecified, unspecified trimester: Secondary | ICD-10-CM

## 2017-06-28 DIAGNOSIS — O0993 Supervision of high risk pregnancy, unspecified, third trimester: Secondary | ICD-10-CM

## 2017-06-28 LAB — OBSTETRIC PANEL, INCLUDING HIV
ANTIBODY SCREEN: NEGATIVE
BASOS: 0 %
Basophils Absolute: 0 10*3/uL (ref 0.0–0.2)
EOS (ABSOLUTE): 0.2 10*3/uL (ref 0.0–0.4)
Eos: 3 %
HEMATOCRIT: 33.3 % — AB (ref 34.0–46.6)
HEMOGLOBIN: 11.1 g/dL (ref 11.1–15.9)
HIV Screen 4th Generation wRfx: NONREACTIVE
Hepatitis B Surface Ag: NEGATIVE
IMMATURE GRANS (ABS): 0 10*3/uL (ref 0.0–0.1)
IMMATURE GRANULOCYTES: 0 %
LYMPHS: 32 %
Lymphocytes Absolute: 2.4 10*3/uL (ref 0.7–3.1)
MCH: 27.2 pg (ref 26.6–33.0)
MCHC: 33.3 g/dL (ref 31.5–35.7)
MCV: 82 fL (ref 79–97)
MONOCYTES: 8 %
Monocytes Absolute: 0.6 10*3/uL (ref 0.1–0.9)
NEUTROS PCT: 57 %
Neutrophils Absolute: 4.3 10*3/uL (ref 1.4–7.0)
Platelets: 259 10*3/uL (ref 150–379)
RBC: 4.08 x10E6/uL (ref 3.77–5.28)
RDW: 14.8 % (ref 12.3–15.4)
RPR Ser Ql: NONREACTIVE
RUBELLA: 8.33 {index} (ref >0.99)
Rh Factor: POSITIVE
WBC: 7.6 10*3/uL (ref 3.4–10.8)

## 2017-06-28 LAB — HEMOGLOBINOPATHY EVALUATION
Ferritin: 14 ng/mL — ABNORMAL LOW (ref 15–150)
HGB A: 97.7 % (ref 96.4–98.8)
HGB F QUANT: 0 % (ref 0.0–2.0)
HGB S: 0 %
HGB VARIANT: 0 %
Hgb A2 Quant: 2.3 % (ref 1.8–3.2)
Hgb C: 0 %
Hgb Solubility: NEGATIVE

## 2017-06-28 LAB — CYSTIC FIBROSIS GENE TEST

## 2017-06-28 NOTE — Progress Notes (Signed)
Pt having lower abdominal pain

## 2017-06-28 NOTE — Progress Notes (Signed)
   PRENATAL VISIT NOTE  Subjective:  Whitney Morrison is a 37 y.o. G3P1101 at [redacted]w[redacted]d being seen today for ongoing prenatal care.  She is currently monitored for the following issues for this high-risk pregnancy and has Supervision of high risk pregnancy, antepartum; Chronic hypertension during pregnancy, antepartum; History of breast cancer; History of IUFD; and Group B streptococcal carriage complicating pregnancy on their problem list.  Patient reports no complaints. Denies leaking of fluid. N VB she reports irreg ctx.    The following portions of the patient's history were reviewed and updated as appropriate: allergies, current medications, past family history, past medical history, past social history, past surgical history and problem list. Problem list updated.  Objective:  There were no vitals filed for this visit.  Fetal Status:           General:  Alert, oriented and cooperative. Patient is in no acute distress.  Skin: Skin is warm and dry. No rash noted.   Cardiovascular: Normal heart rate noted  Respiratory: Normal respiratory effort, no problems with respiration noted  Abdomen: Soft, gravid, appropriate for gestational age.        Pelvic: Cervical exam preformed  Extremities: Normal range of motion.     Mental Status:  Normal mood and affect. Normal behavior. Normal judgment and thought content.   Assessment and Plan:  Pregnancy: G3P1101 at [redacted]w[redacted]d  1. Supervision of high risk pregnancy, antepartum  2. History of IUFD Rec IOL at 39 weeks if not delivered by that time.    3. History of breast cancer  4. Group B streptococcal carriage complicating pregnancy Need atbx in labor  Term labor symptoms and general obstetric precautions including but not limited to vaginal bleeding, contractions, leaking of fluid and fetal movement were reviewed in detail with the patient. Please refer to After Visit Summary for other counseling recommendations.  Return in about 1 week (around  07/05/2017).   Lavonia Drafts, MD

## 2017-07-04 ENCOUNTER — Other Ambulatory Visit: Payer: Self-pay | Admitting: Family Medicine

## 2017-07-04 DIAGNOSIS — O0990 Supervision of high risk pregnancy, unspecified, unspecified trimester: Secondary | ICD-10-CM

## 2017-07-05 ENCOUNTER — Ambulatory Visit: Payer: Self-pay

## 2017-07-05 ENCOUNTER — Other Ambulatory Visit: Payer: Self-pay | Admitting: Advanced Practice Midwife

## 2017-07-05 ENCOUNTER — Inpatient Hospital Stay (HOSPITAL_COMMUNITY)
Admission: AD | Admit: 2017-07-05 | Discharge: 2017-07-05 | Disposition: A | Discharge status: Home / Self Care | Payer: Self-pay | Source: Ambulatory Visit | Attending: Obstetrics and Gynecology | Admitting: Obstetrics and Gynecology

## 2017-07-05 ENCOUNTER — Telehealth (HOSPITAL_COMMUNITY): Payer: Self-pay | Admitting: *Deleted

## 2017-07-05 ENCOUNTER — Ambulatory Visit (INDEPENDENT_AMBULATORY_CARE_PROVIDER_SITE_OTHER): Payer: Self-pay | Admitting: Student

## 2017-07-05 ENCOUNTER — Encounter (HOSPITAL_COMMUNITY): Payer: Self-pay | Admitting: *Deleted

## 2017-07-05 VITALS — BP 109/67 | HR 120 | Wt 215.7 lb

## 2017-07-05 DIAGNOSIS — O368130 Decreased fetal movements, third trimester, not applicable or unspecified: Secondary | ICD-10-CM | POA: Insufficient documentation

## 2017-07-05 DIAGNOSIS — O0990 Supervision of high risk pregnancy, unspecified, unspecified trimester: Secondary | ICD-10-CM

## 2017-07-05 DIAGNOSIS — Z8759 Personal history of other complications of pregnancy, childbirth and the puerperium: Secondary | ICD-10-CM

## 2017-07-05 DIAGNOSIS — O10919 Unspecified pre-existing hypertension complicating pregnancy, unspecified trimester: Secondary | ICD-10-CM

## 2017-07-05 DIAGNOSIS — O10913 Unspecified pre-existing hypertension complicating pregnancy, third trimester: Secondary | ICD-10-CM

## 2017-07-05 DIAGNOSIS — O99820 Streptococcus B carrier state complicating pregnancy: Secondary | ICD-10-CM

## 2017-07-05 DIAGNOSIS — Z3A38 38 weeks gestation of pregnancy: Secondary | ICD-10-CM | POA: Insufficient documentation

## 2017-07-05 DIAGNOSIS — Z3689 Encounter for other specified antenatal screening: Secondary | ICD-10-CM

## 2017-07-05 DIAGNOSIS — O10019 Pre-existing essential hypertension complicating pregnancy, unspecified trimester: Secondary | ICD-10-CM

## 2017-07-05 DIAGNOSIS — O0993 Supervision of high risk pregnancy, unspecified, third trimester: Secondary | ICD-10-CM

## 2017-07-05 DIAGNOSIS — O10013 Pre-existing essential hypertension complicating pregnancy, third trimester: Secondary | ICD-10-CM | POA: Insufficient documentation

## 2017-07-05 LAB — URINALYSIS, ROUTINE W REFLEX MICROSCOPIC
Bilirubin Urine: NEGATIVE
Glucose, UA: NEGATIVE mg/dL
Hgb urine dipstick: NEGATIVE
KETONES UR: NEGATIVE mg/dL
LEUKOCYTES UA: NEGATIVE
Nitrite: NEGATIVE
PROTEIN: NEGATIVE mg/dL
Specific Gravity, Urine: 1.019 (ref 1.005–1.030)
pH: 5 (ref 5.0–8.0)

## 2017-07-05 LAB — CBC WITH DIFFERENTIAL/PLATELET
Basophils Absolute: 0 10*3/uL (ref 0.0–0.1)
Basophils Relative: 0 %
EOS ABS: 0.2 10*3/uL (ref 0.0–0.7)
Eosinophils Relative: 3 %
HEMATOCRIT: 30.2 % — AB (ref 36.0–46.0)
HEMOGLOBIN: 10.4 g/dL — AB (ref 12.0–15.0)
Lymphocytes Relative: 32 %
Lymphs Abs: 2.3 10*3/uL (ref 0.7–4.0)
MCH: 27.7 pg (ref 26.0–34.0)
MCHC: 34.4 g/dL (ref 30.0–36.0)
MCV: 80.5 fL (ref 78.0–100.0)
MONO ABS: 0.5 10*3/uL (ref 0.1–1.0)
MONOS PCT: 6 %
NEUTROS ABS: 4.2 10*3/uL (ref 1.7–7.7)
NEUTROS PCT: 59 %
Platelets: 231 10*3/uL (ref 150–400)
RBC: 3.75 MIL/uL — ABNORMAL LOW (ref 3.87–5.11)
RDW: 13.9 % (ref 11.5–15.5)
WBC: 7.3 10*3/uL (ref 4.0–10.5)

## 2017-07-05 LAB — COMPREHENSIVE METABOLIC PANEL
ALK PHOS: 124 U/L (ref 38–126)
ALT: 10 U/L — ABNORMAL LOW (ref 14–54)
ANION GAP: 8 (ref 5–15)
AST: 16 U/L (ref 15–41)
Albumin: 2.9 g/dL — ABNORMAL LOW (ref 3.5–5.0)
BILIRUBIN TOTAL: 0.3 mg/dL (ref 0.3–1.2)
BUN: 6 mg/dL (ref 6–20)
CALCIUM: 8.7 mg/dL — AB (ref 8.9–10.3)
CO2: 18 mmol/L — ABNORMAL LOW (ref 22–32)
Chloride: 106 mmol/L (ref 101–111)
Creatinine, Ser: 0.46 mg/dL (ref 0.44–1.00)
GFR calc Af Amer: >60 mL/min (ref >60)
GFR calc non Af Amer: >60 mL/min (ref >60)
Glucose, Bld: 85 mg/dL (ref 65–99)
Potassium: 3.6 mmol/L (ref 3.5–5.1)
Sodium: 132 mmol/L — ABNORMAL LOW (ref 135–145)
TOTAL PROTEIN: 6.6 g/dL (ref 6.5–8.1)

## 2017-07-05 LAB — PROTEIN / CREATININE RATIO, URINE
CREATININE, URINE: 170 mg/dL
PROTEIN CREATININE RATIO: 0.05 mg/mg{creat} (ref 0.00–0.15)
TOTAL PROTEIN, URINE: 8 mg/dL

## 2017-07-05 MED ORDER — LACTATED RINGERS IV BOLUS (SEPSIS)
1000.0000 mL | Freq: Once | INTRAVENOUS | Status: AC
  Administered 2017-07-05: 1000 mL via INTRAVENOUS

## 2017-07-05 NOTE — Patient Instructions (Signed)
Labor Induction Labor induction is when steps are taken to cause a pregnant woman to begin the labor process. Most women go into labor on their own between 37 weeks and 42 weeks of the pregnancy. When this does not happen or when there is a medical need, methods may be used to induce labor. Labor induction causes a pregnant woman's uterus to contract. It also causes the cervix to soften (ripen), open (dilate), and thin out (efface). Usually, labor is not induced before 39 weeks of the pregnancy unless there is a problem with the baby or mother. Before inducing labor, your health care provider will consider a number of factors, including the following:  The medical condition of you and the baby.  How many weeks along you are.  The status of the baby's lung maturity.  The condition of the cervix.  The position of the baby. What are the reasons for labor induction? Labor may be induced for the following reasons:  The health of the baby or mother is at risk.  The pregnancy is overdue by 1 week or more.  The water breaks but labor does not start on its own.  The mother has a health condition or serious illness, such as high blood pressure, infection, placental abruption, or diabetes.  The amniotic fluid amounts are low around the baby.  The baby is distressed. Convenience or wanting the baby to be born on a certain date is not a reason for inducing labor. What methods are used for labor induction? Several methods of labor induction may be used, such as:  Prostaglandin medicine. This medicine causes the cervix to dilate and ripen. The medicine will also start contractions. It can be taken by mouth or by inserting a suppository into the vagina.  Inserting a thin tube (catheter) with a balloon on the end into the vagina to dilate the cervix. Once inserted, the balloon is expanded with water, which causes the cervix to open.  Stripping the membranes. Your health care provider separates  amniotic sac tissue from the cervix, causing the cervix to be stretched and causing the release of a hormone called progesterone. This may cause the uterus to contract. It is often done during an office visit. You will be sent home to wait for the contractions to begin. You will then come in for an induction.  Breaking the water. Your health care provider makes a hole in the amniotic sac using a small instrument. Once the amniotic sac breaks, contractions should begin. This may still take hours to see an effect.  Medicine to trigger or strengthen contractions. This medicine is given through an IV access tube inserted into a vein in your arm. All of the methods of induction, besides stripping the membranes, will be done in the hospital. Induction is done in the hospital so that you and the baby can be carefully monitored. How long does it take for labor to be induced? Some inductions can take up to 2-3 days. Depending on the cervix, it usually takes less time. It takes longer when you are induced early in the pregnancy or if this is your first pregnancy. If a mother is still pregnant and the induction has been going on for 2-3 days, either the mother will be sent home or a cesarean delivery will be needed. What are the risks associated with labor induction? Some of the risks of induction include:  Changes in fetal heart rate, such as too high, too low, or erratic.  Fetal distress.    Chance of infection for the mother and baby.  Increased chance of having a cesarean delivery.  Breaking off (abruption) of the placenta from the uterus (rare).  Uterine rupture (very rare). When induction is needed for medical reasons, the benefits of induction may outweigh the risks. What are some reasons for not inducing labor? Labor induction should not be done if:  It is shown that your baby does not tolerate labor.  You have had previous surgeries on your uterus, such as a myomectomy or the removal of  fibroids.  Your placenta lies very low in the uterus and blocks the opening of the cervix (placenta previa).  Your baby is not in a head-down position.  The umbilical cord drops down into the birth canal in front of the baby. This could cut off the baby's blood and oxygen supply.  You have had a previous cesarean delivery.  There are unusual circumstances, such as the baby being extremely premature. This information is not intended to replace advice given to you by your health care provider. Make sure you discuss any questions you have with your health care provider. Document Released: 10/13/2006 Document Revised: 10/30/2015 Document Reviewed: 12/21/2012 Elsevier Interactive Patient Education  2017 [employer]  

## 2017-07-05 NOTE — Progress Notes (Signed)
   PRENATAL VISIT NOTE  Subjective:  Whitney Morrison is a 37 y.o. G3P1101 at [redacted]w[redacted]d being seen today for ongoing prenatal care.  She is currently monitored for the following issues for this high-risk pregnancy and has Supervision of high risk pregnancy, antepartum; Chronic hypertension during pregnancy, antepartum; History of breast cancer; History of IUFD; and Group B streptococcal carriage complicating pregnancy on their problem list.  Patient reports occasional contractions.  Contractions: Irritability. Vag. Bleeding: None.  Movement: Present. Denies leaking of fluid.   The following portions of the patient's history were reviewed and updated as appropriate: allergies, current medications, past family history, past medical history, past social history, past surgical history and problem list. Problem list updated.  Objective:   Vitals:   07/05/17 1132  BP: 109/67  Pulse: (!) 120  Weight: 215 lb 11.2 oz (97.8 kg)    Fetal Status: Fetal Heart Rate (bpm): 144 Fundal Height: 36 cm Movement: Present  Presentation: Vertex  General:  Alert, oriented and cooperative. Patient is in no acute distress.  Skin: Skin is warm and dry. No rash noted.   Cardiovascular: Normal heart rate noted  Respiratory: Normal respiratory effort, no problems with respiration noted  Abdomen: Soft, gravid, appropriate for gestational age.  Pain/Pressure: Present     Pelvic: Cervical exam performed Dilation: 2 Effacement (%): 50 Station: -3  Extremities: Normal range of motion.  Edema: None  Mental Status:  Normal mood and affect. Normal behavior. Normal judgment and thought content.   Assessment and Plan:  Pregnancy: G3P1101 at [redacted]w[redacted]d  1. Supervision of high risk pregnancy, antepartum -IOL scheduled for 07/10/17  2. History of IUFD  - US FETAL BPP W/NONSTRESS; Future  3. Chronic hypertension during pregnancy, antepartum  - US FETAL BPP W/NONSTRESS; Future  Term labor symptoms and general obstetric precautions  including but not limited to vaginal bleeding, contractions, leaking of fluid and fetal movement were reviewed in detail with the patient. Please refer to After Visit Summary for other counseling recommendations.  No Follow-up on file.   Jorje Guild, NP

## 2017-07-05 NOTE — Progress Notes (Signed)
NST performed today was reviewed and was found to be reactive.  Continue recommended antenatal testing and prenatal care.  Stephone Gum, MD, FACOG Obstetrician & Gynecologist, [facility], [facility]  

## 2017-07-05 NOTE — Progress Notes (Signed)

## 2017-07-05 NOTE — Telephone Encounter (Signed)
Preadmission screen  

## 2017-07-05 NOTE — [facility] Note (Signed)
Pt discharged, in room eating.

## 2017-07-05 NOTE — Discharge Instructions (Signed)
Hypertension During Pregnancy Hypertension is also called high blood pressure. High blood pressure means that the force of your blood moving in your body is too strong. When you are pregnant, this condition should be watched carefully. It can cause problems for you and your baby. Follow these instructions at home: Eating and drinking  Drink enough fluid to keep your pee (urine) clear or pale yellow.  Eat healthy foods that are low in salt (sodium). ? Do not add salt to your food. ? Check labels on foods and drinks to see much salt is in them. Look on the label where you see "Sodium." Lifestyle  Do not use any products that contain nicotine or tobacco, such as cigarettes and e-cigarettes. If you need help quitting, ask your doctor.  Do not use alcohol.  Avoid caffeine.  Avoid stress. Rest and get plenty of sleep. General instructions  Take over-the-counter and prescription medicines only as told by your doctor.  While lying down, lie on your left side. This keeps pressure off your baby.  While sitting or lying down, raise (elevate) your feet. Try putting some pillows under your lower legs.  Exercise regularly. Ask your doctor what kinds of exercise are best for you.  Keep all prenatal and follow-up visits as told by your doctor. This is important. Contact a doctor if:  You have symptoms that your doctor told you to watch for, such as: ? Fever. ? Throwing up (vomiting). ? Headache. Get help right away if:  You have very bad pain in your belly (abdomen).  You are throwing up, and this does not get better with treatment.  You suddenly get swelling in your hands, ankles, or face.  You gain 4 lb (1.8 kg) or more in 1 week.  You get bleeding from your vagina.  You have blood in your pee.  You do not feel your baby moving as much as normal.  You have a change in vision.  You have muscle twitching or sudden tightening (spasms).  You have trouble breathing.  Your lips  or fingernails turn blue. This information is not intended to replace advice given to you by your health care provider. Make sure you discuss any questions you have with your health care provider. Document Released: 06/26/2010 Document Revised: 02/03/2016 Document Reviewed: 02/03/2016 Elsevier Interactive Patient Education  2018 [employer]. Preeclampsia and Eclampsia Preeclampsia is a serious condition that develops only during pregnancy. It is also called toxemia of pregnancy. This condition causes high blood pressure along with other symptoms, such as swelling and headaches. These symptoms may develop as the condition gets worse. Preeclampsia may occur at 20 weeks of pregnancy or later. Diagnosing and treating preeclampsia early is very important. If not treated early, it can cause serious problems for you and your baby. One problem it can lead to is eclampsia, which is a condition that causes muscle jerking or shaking (convulsions or seizures) in the mother. Delivering your baby is the best treatment for preeclampsia or eclampsia. Preeclampsia and eclampsia symptoms usually go away after your baby is born. What are the causes? The cause of preeclampsia is not known. What increases the risk? The following risk factors make you more likely to develop preeclampsia:  Being pregnant for the first time.  Having had preeclampsia during a past pregnancy.  Having a family history of preeclampsia.  Having high blood pressure.  Being pregnant with twins or triplets.  Being 34 or older.  Being African-American.  Having kidney disease or diabetes.  Having medical conditions such as lupus or blood diseases.  Being very overweight (obese).  What are the signs or symptoms? The earliest signs of preeclampsia are:  High blood pressure.  Increased protein in your urine. Your health care provider will check for this at every visit before you give birth (prenatal visit).  Other symptoms that  may develop as the condition gets worse include:  Severe headaches.  Sudden weight gain.  Swelling of the hands, face, legs, and feet.  Nausea and vomiting.  Vision problems, such as blurred or double vision.  Numbness in the face, arms, legs, and feet.  Urinating less than usual.  Dizziness.  Slurred speech.  Abdominal pain, especially upper abdominal pain.  Convulsions or seizures.  Symptoms generally go away after giving birth. How is this diagnosed? There are no screening tests for preeclampsia. Your health care provider will ask you about symptoms and check for signs of preeclampsia during your prenatal visits. You may also have tests that include:  Urine tests.  Blood tests.  Checking your blood pressure.  Monitoring your babys heart rate.  Ultrasound.  How is this treated? You and your health care provider will determine the treatment approach that is best for you. Treatment may include:  Having more frequent prenatal exams to check for signs of preeclampsia, if you have an increased risk for preeclampsia.  Bed rest.  Reducing how much salt (sodium) you eat.  Medicine to lower your blood pressure.  Staying in the hospital, if your condition is severe. There, treatment will focus on controlling your blood pressure and the amount of fluids in your body (fluid retention).  You may need to take medicine (magnesium sulfate) to prevent seizures. This medicine may be given as an injection or through an IV tube.  Delivering your baby early, if your condition gets worse. You may have your labor started with medicine (induced), or you may have a cesarean delivery.  Follow these instructions at home: Eating and drinking   Drink enough fluid to keep your urine clear or pale yellow.  Eat a healthy diet that is low in sodium. Do not add salt to your food. Check nutrition labels to see how much sodium a food or beverage contains.  Avoid  caffeine. Lifestyle  Do not use any products that contain nicotine or tobacco, such as cigarettes and e-cigarettes. If you need help quitting, ask your health care provider.  Do not use alcohol or drugs.  Avoid stress as much as possible. Rest and get plenty of sleep. General instructions  Take over-the-counter and prescription medicines only as told by your health care provider.  When lying down, lie on your side. This keeps pressure off of your baby.  When sitting or lying down, raise (elevate) your feet. Try putting some pillows underneath your lower legs.  Exercise regularly. Ask your health care provider what kinds of exercise are best for you.  Keep all follow-up and prenatal visits as told by your health care provider. This is important. How is this prevented? To prevent preeclampsia or eclampsia from developing during another pregnancy:  Get proper medical care during pregnancy. Your health care provider may be able to prevent preeclampsia or diagnose and treat it early.  Your health care provider may have you take a low-dose aspirin or a calcium supplement during your next pregnancy.  You may have tests of your blood pressure and kidney function after giving birth.  Maintain a healthy weight. Ask your health care provider for  help managing weight gain during pregnancy.  Work with your health care provider to manage any long-term (chronic) health conditions you have, such as diabetes or kidney problems.  Contact a health care provider if:  You gain more weight than expected.  You have headaches.  You have nausea or vomiting.  You have abdominal pain.  You feel dizzy or light-headed. Get help right away if:  You develop sudden or severe swelling anywhere in your body. This usually happens in the legs.  You gain 5 lbs (2.3 kg) or more during one week.  You have severe: ? Abdominal pain. ? Headaches. ? Dizziness. ? Vision problems. ? Confusion. ? Nausea or  vomiting.  You have a seizure.  You have trouble moving any part of your body.  You develop numbness in any part of your body.  You have trouble speaking.  You have any abnormal bleeding.  You pass out. This information is not intended to replace advice given to you by your health care provider. Make sure you discuss any questions you have with your health care provider. Document Released: 05/21/2000 Document Revised: 01/20/2016 Document Reviewed: 12/29/2015 Elsevier Interactive Patient Education  [employer].

## 2017-07-05 NOTE — MAU Provider Note (Signed)
History     CSN: [id number]  Arrival date and time: 07/05/17 1305   First Provider Initiated Contact with Patient 07/05/17 1336      Chief Complaint  Patient presents with  . Contractions  . recurrent fetal variables   HPI Ms. Whitney Morrison is a 37 y.o. G3P1101 at [redacted]w[redacted]d with CHTN and history of previous IUFD who presents to MAU today from the [facility] for further monitoring and IV fluids. The patient had a NRNST in the office with recurrent decelerations. She then had 8/8 BPP. She states she has noted decreased fetal movement. She states contractions are mild and noted q 5-10 minutes. She denies vaginal bleeding or LOF.   OB History    Gravida Para Term Preterm AB Living   3 2 1 1  0 1   SAB TAB Ectopic Multiple Live Births           1      Obstetric Comments   G2: IUFD ~7 months per patient      Past Medical History:  Diagnosis Date  . Breast cancer ([place])   . Hypertension   . Lymph edema   . Pregnancy induced hypertension    during her 2005 pregnancy and ever since    Past Surgical History:  Procedure Laterality Date  . BREAST SURGERY      Family History  Problem Relation Age of Onset  . Diabetes Father   . Hypertension Maternal Grandmother     Social History   Tobacco Use  . Smoking status: Never Smoker  . Smokeless tobacco: Never Used  Substance Use Topics  . Alcohol use: No    Frequency: Never  . Drug use: No    Allergies: No Known Allergies  Medications Prior to Admission  Medication Sig Dispense Refill Last Dose  . acetaminophen (TYLENOL) 325 MG tablet Take 650 mg by mouth every 6 (six) hours as needed for mild pain or headache.   Not Taking  . calcium carbonate (TUMS - DOSED IN MG ELEMENTAL CALCIUM) 500 MG chewable tablet Chew 1 tablet by mouth daily as needed for indigestion or heartburn.   Taking  . labetalol (NORMODYNE) 200 MG tablet Take 1 tablet (200 mg total) by mouth 3 (three) times daily. 180 tablet 1 Taking  . prenatal vitamin w/FE, FA  (PRENATAL 1 + 1) 27-1 MG TABS tablet Take 1 tablet by mouth daily at 12 noon.   Taking    Review of Systems  Constitutional: Negative for fever.  Gastrointestinal: Negative for abdominal pain.  Genitourinary: Negative for vaginal bleeding and vaginal discharge.   Physical Exam   Blood pressure 125/88, pulse 82, resp. rate 18, weight 216 lb 1.3 oz (98 kg), last menstrual period 10/09/2016, SpO2 100 %.  Physical Exam  Nursing note and vitals reviewed. Constitutional: She is oriented to person, place, and time. She appears well-developed and well-nourished. No distress.  HENT:  Head: Normocephalic and atraumatic.  Cardiovascular: Normal rate.  Respiratory: Effort normal.  GI: Soft. She exhibits no distension.  Neurological: She is alert and oriented to person, place, and time.  Skin: Skin is warm and dry. No erythema.  Psychiatric: She has a normal mood and affect.    Results for orders placed or performed during the hospital encounter of 07/05/17 (from the past 24 hour(s))  Urinalysis, Routine w reflex microscopic     Status: None   Collection Time: 07/05/17  1:09 PM  Result Value Ref Range   Color, Urine YELLOW YELLOW  APPearance CLEAR CLEAR   Specific Gravity, Urine 1.019 1.005 - 1.030   pH 5.0 5.0 - 8.0   Glucose, UA NEGATIVE NEGATIVE mg/dL   Hgb urine dipstick NEGATIVE NEGATIVE   Bilirubin Urine NEGATIVE NEGATIVE   Ketones, ur NEGATIVE NEGATIVE mg/dL   Protein, ur NEGATIVE NEGATIVE mg/dL   Nitrite NEGATIVE NEGATIVE   Leukocytes, UA NEGATIVE NEGATIVE  Protein / creatinine ratio, urine     Status: None   Collection Time: 07/05/17  1:09 PM  Result Value Ref Range   Creatinine, Urine 170.00 mg/dL   Total Protein, Urine 8 mg/dL   Protein Creatinine Ratio 0.05 0.00 - 0.15 mg/mg[Cre]  CBC with Differential/Platelet     Status: Abnormal   Collection Time: 07/05/17  2:30 PM  Result Value Ref Range   WBC 7.3 4.0 - 10.5 K/uL   RBC 3.75 (L) 3.87 - 5.11 MIL/uL   Hemoglobin  10.4 (L) 12.0 - 15.0 g/dL   HCT 30.2 (L) 36.0 - 46.0 %   MCV 80.5 78.0 - 100.0 fL   MCH 27.7 26.0 - 34.0 pg   MCHC 34.4 30.0 - 36.0 g/dL   RDW 13.9 11.5 - 15.5 %   Platelets 231 150 - 400 K/uL   Neutrophils Relative % 59 %   Neutro Abs 4.2 1.7 - 7.7 K/uL   Lymphocytes Relative 32 %   Lymphs Abs 2.3 0.7 - 4.0 K/uL   Monocytes Relative 6 %   Monocytes Absolute 0.5 0.1 - 1.0 K/uL   Eosinophils Relative 3 %   Eosinophils Absolute 0.2 0.0 - 0.7 K/uL   Basophils Relative 0 %   Basophils Absolute 0.0 0.0 - 0.1 K/uL  Comprehensive metabolic panel     Status: Abnormal   Collection Time: 07/05/17  2:30 PM  Result Value Ref Range   Sodium 132 (L) 135 - 145 mmol/L   Potassium 3.6 3.5 - 5.1 mmol/L   Chloride 106 101 - 111 mmol/L   CO2 18 (L) 22 - 32 mmol/L   Glucose, Bld 85 65 - 99 mg/dL   BUN 6 6 - 20 mg/dL   Creatinine, Ser 0.46 0.44 - 1.00 mg/dL   Calcium 8.7 (L) 8.9 - 10.3 mg/dL   Total Protein 6.6 6.5 - 8.1 g/dL   Albumin 2.9 (L) 3.5 - 5.0 g/dL   AST 16 15 - 41 U/L   ALT 10 (L) 14 - 54 U/L   Alkaline Phosphatase 124 38 - 126 U/L   Total Bilirubin 0.3 0.3 - 1.2 mg/dL   GFR calc non Af Amer >60 >60 mL/min   GFR calc Af Amer >60 >60 mL/min   Anion gap 8 5 - 15    Fetal Monitoring: Baseline: 140 bpm Variability: moderate Accelerations: 15 x 15 Decelerations: none Contractions: q 3-5 minutes  MAU Course  Procedures None  MDM IV LR bolus ordered  CBC, CMP, UA and urine protein/creatinine ratio Discussed patient with Dr. Nehemiah Settle. [place] for discharge at this time. Follow-up as scheduled for IOL this weekend. Called LD to confirm Sunday midnight was earliest available, no earlier induction times available.  Assessment and Plan  A: SIUP at [redacted]w[redacted]d CHTN Reactive NST   P:  Discharge home Labor precautions and warning signs for worsening HTN discussed Patient advised to follow-up as scheduled for IOL Patient may return to MAU as needed or if her condition were to change or  worsen   Kerry Hough, PA-C 07/05/2017, 4:01 PM

## 2017-07-05 NOTE — MAU Note (Signed)
Patient sent up from clinic for further evaluation of fetal heart rate and contractions.  Patient denies LOF or VB. +FM  Reported to MAU that BPP was 8/8, NST with recurrent variables, and that patient was contracting.

## 2017-07-06 ENCOUNTER — Telehealth (HOSPITAL_COMMUNITY): Payer: Self-pay | Admitting: *Deleted

## 2017-07-06 NOTE — Telephone Encounter (Signed)
Preadmission screen  

## 2017-07-10 NOTE — H&P (Signed)
Obstetric History and Physical  Whitney Morrison is a 37 y.o. G3P1101 with IUP at [redacted]w[redacted]d presenting for IOL for cHTN and previous IUFD around 7 months per patient. Patient received most of her prenatal care in [place].  Prenatal Course Source of Care: [facility] Pregnancy complications or risks: Patient Active Problem List   Diagnosis Date Noted  . Group B streptococcal carriage complicating pregnancy 25/36/6440  . Supervision of high risk pregnancy, antepartum 06/21/2017  . Chronic hypertension during pregnancy, antepartum 06/21/2017  . History of breast cancer 06/21/2017  . History of IUFD 06/21/2017   She plans to breastfeed She desires undecided for postpartum contraception.   Prenatal labs and studies: ABO, Rh: B/Positive/-- (01/15 1239) Antibody: Negative (01/15 1239) Rubella: 8.33 (01/15 1239) RPR: Non Reactive (01/15 1239)  HBsAg: Negative (01/15 1239)  HIV: Non Reactive (01/15 1239)  GBS: positive Genetic screening too late to care Anatomy US normal  Prenatal Transfer Tool  Maternal Diabetes: No Maternal Ultrasounds/Referrals: Normal Fetal Ultrasounds or other Referrals:  None Maternal Substance Abuse:  No Significant Maternal Medications:  None Significant Maternal Lab Results: Lab values include: Group B Strep positive  Past Medical History:  Diagnosis Date  . Breast cancer ([place])   . Hypertension   . Lymph edema   . Pregnancy induced hypertension    during her 2005 pregnancy and ever since    Past Surgical History:  Procedure Laterality Date  . BREAST SURGERY      OB History  Gravida Para Term Preterm AB Living  3 2 1 1  0 1  SAB TAB Ectopic Multiple Live Births          1    # Outcome Date GA Lbr Len/2nd Weight Sex Delivery Anes PTL Lv  3 Current           2 Preterm         FD  1 Term     M Vag-Spont   LIV    Obstetric Comments  G2: IUFD ~7 months per patient    Social History   Socioeconomic History  . Marital status: Married    Spouse name: Not on  file  . Number of children: Not on file  . Years of education: Not on file  . Highest education level: Not on file  Social Needs  . Financial resource strain: Not on file  . Food insecurity - worry: Not on file  . Food insecurity - inability: Not on file  . Transportation needs - medical: Not on file  . Transportation needs - non-medical: Not on file  Occupational History  . Not on file  Tobacco Use  . Smoking status: Never Smoker  . Smokeless tobacco: Never Used  Substance and Sexual Activity  . Alcohol use: No    Frequency: Never  . Drug use: No  . Sexual activity: Yes    Birth control/protection: None    Comment: last sex 45 days ago  Other Topics Concern  . Not on file  Social History Narrative  . Not on file    Family History  Problem Relation Age of Onset  . Diabetes Father   . Hypertension Maternal Grandmother      (Not in a hospital admission)  No Known Allergies  Review of Systems: Negative except for what is mentioned in HPI.  Physical Exam: LMP 10/09/2016  CONSTITUTIONAL: Well-developed, well-nourished female in no acute distress.  HENT:  Normocephalic, atraumatic, External right and left ear normal. Oropharynx is clear and moist EYES:  Conjunctivae and EOM are normal. Pupils are equal, round, and reactive to light. No scleral icterus.  NECK: Normal range of motion, supple, no masses SKIN: Skin is warm and dry. No rash noted. Not diaphoretic. No erythema. No pallor. NEUROLOGIC: Alert and oriented to person, place, and time. Normal reflexes, muscle tone coordination. No cranial nerve deficit noted. PSYCHIATRIC: Normal mood and affect. Normal behavior. Normal judgment and thought content. CARDIOVASCULAR: Normal heart rate noted, regular rhythm RESPIRATORY: Effort and breath sounds normal, no problems with respiration noted ABDOMEN: Soft, nontender, nondistended, gravid. MUSCULOSKELETAL: Normal range of motion.  2+ distal pulses. Left arm is very  edematous  Cervical Exam: Dilatation 4cm   Effacement 50%   Station -2   Presentation: cephalic FHT:  Baseline rate 140 bpm   Variability moderate  Accelerations present   Decelerations none   Pertinent Labs/Studies:   No results found for this or any previous visit (from the past 24 hour(s)).  Assessment : Whitney Morrison is a 37 y.o. G3P1101 at [redacted]w[redacted]d being admitted for induction of labor due to [facility] and previous IUFD at 50 months.  Plan: Labor:  Induction/Augmentation as ordered as per protocol. Analgesia as needed. FWB: Reassuring fetal heart tracing.  GBS positive-PCN to treat Delivery plan: Hopeful for vaginal delivery CHTN: blood pressure elevated on admission. Will get cbc, cmp, and pr/cr. monitor blood pressure while in labor MOF: breast MOC: undecided Hx breast cancer: s/p resection and chemoradiation. Needs repeat mammogram in 6 months.  Dannielle Huh, DO

## 2017-07-11 ENCOUNTER — Encounter (HOSPITAL_COMMUNITY): Payer: Self-pay

## 2017-07-11 ENCOUNTER — Inpatient Hospital Stay (HOSPITAL_COMMUNITY): Payer: Medicaid Other | Admitting: Anesthesiology

## 2017-07-11 ENCOUNTER — Inpatient Hospital Stay (HOSPITAL_COMMUNITY)
Admission: RE | Admit: 2017-07-11 | Discharge: 2017-07-13 | DRG: 807 | Disposition: A | Discharge status: Home / Self Care | Payer: Medicaid Other | Source: Ambulatory Visit | Attending: Obstetrics & Gynecology | Admitting: Obstetrics & Gynecology

## 2017-07-11 VITALS — BP 129/92 | HR 88 | Temp 97.3°F | Resp 16 | Wt 218.8 lb

## 2017-07-11 DIAGNOSIS — O1002 Pre-existing essential hypertension complicating childbirth: Secondary | ICD-10-CM | POA: Diagnosis present

## 2017-07-11 DIAGNOSIS — O99824 Streptococcus B carrier state complicating childbirth: Secondary | ICD-10-CM | POA: Diagnosis present

## 2017-07-11 DIAGNOSIS — O1092 Unspecified pre-existing hypertension complicating childbirth: Secondary | ICD-10-CM

## 2017-07-11 DIAGNOSIS — O43123 Velamentous insertion of umbilical cord, third trimester: Secondary | ICD-10-CM | POA: Diagnosis present

## 2017-07-11 DIAGNOSIS — Z8759 Personal history of other complications of pregnancy, childbirth and the puerperium: Secondary | ICD-10-CM

## 2017-07-11 DIAGNOSIS — Z3A39 39 weeks gestation of pregnancy: Secondary | ICD-10-CM | POA: Diagnosis not present

## 2017-07-11 DIAGNOSIS — O10919 Unspecified pre-existing hypertension complicating pregnancy, unspecified trimester: Secondary | ICD-10-CM | POA: Diagnosis present

## 2017-07-11 DIAGNOSIS — O0990 Supervision of high risk pregnancy, unspecified, unspecified trimester: Secondary | ICD-10-CM

## 2017-07-11 DIAGNOSIS — Z853 Personal history of malignant neoplasm of breast: Secondary | ICD-10-CM | POA: Diagnosis not present

## 2017-07-11 DIAGNOSIS — O99820 Streptococcus B carrier state complicating pregnancy: Secondary | ICD-10-CM

## 2017-07-11 LAB — COMPREHENSIVE METABOLIC PANEL
ALBUMIN: 2.9 g/dL — AB (ref 3.5–5.0)
ALK PHOS: 136 U/L — AB (ref 38–126)
ALT: 12 U/L — ABNORMAL LOW (ref 14–54)
AST: 21 U/L (ref 15–41)
Anion gap: 11 (ref 5–15)
BILIRUBIN TOTAL: 0.8 mg/dL (ref 0.3–1.2)
BUN: 7 mg/dL (ref 6–20)
CALCIUM: 8.4 mg/dL — AB (ref 8.9–10.3)
CO2: 19 mmol/L — AB (ref 22–32)
CREATININE: 0.55 mg/dL (ref 0.44–1.00)
Chloride: 100 mmol/L — ABNORMAL LOW (ref 101–111)
GFR calc Af Amer: >60 mL/min (ref >60)
GFR calc non Af Amer: >60 mL/min (ref >60)
GLUCOSE: 63 mg/dL — AB (ref 65–99)
Potassium: 4.2 mmol/L (ref 3.5–5.1)
SODIUM: 130 mmol/L — AB (ref 135–145)
TOTAL PROTEIN: 7.1 g/dL (ref 6.5–8.1)

## 2017-07-11 LAB — CBC
HCT: 31.5 % — ABNORMAL LOW (ref 36.0–46.0)
HEMATOCRIT: 31.8 % — AB (ref 36.0–46.0)
HEMOGLOBIN: 10.8 g/dL — AB (ref 12.0–15.0)
HEMOGLOBIN: 10.6 g/dL — AB (ref 12.0–15.0)
MCH: 27.6 pg (ref 26.0–34.0)
MCH: 27.3 pg (ref 26.0–34.0)
MCHC: 34 g/dL (ref 30.0–36.0)
MCHC: 33.7 g/dL (ref 30.0–36.0)
MCV: 81.3 fL (ref 78.0–100.0)
MCV: 81.2 fL (ref 78.0–100.0)
Platelets: 247 10*3/uL (ref 150–400)
Platelets: 231 10*3/uL (ref 150–400)
RBC: 3.91 MIL/uL (ref 3.87–5.11)
RBC: 3.88 MIL/uL (ref 3.87–5.11)
RDW: 14.1 % (ref 11.5–15.5)
RDW: 14.1 % (ref 11.5–15.5)
WBC: 8.9 10*3/uL (ref 4.0–10.5)
WBC: 7.7 10*3/uL (ref 4.0–10.5)

## 2017-07-11 LAB — RPR: RPR Ser Ql: NONREACTIVE

## 2017-07-11 LAB — PROTEIN / CREATININE RATIO, URINE
Creatinine, Urine: 157 mg/dL
Protein Creatinine Ratio: 0.13 mg/mg{Cre} (ref 0.00–0.15)
Total Protein, Urine: 20 mg/dL

## 2017-07-11 LAB — TYPE AND SCREEN
ABO/RH(D): B POS
Antibody Screen: NEGATIVE

## 2017-07-11 LAB — ABO/RH: ABO/RH(D): B POS

## 2017-07-11 MED ORDER — OXYTOCIN BOLUS FROM INFUSION
500.0000 mL | Freq: Once | INTRAVENOUS | Status: AC
  Administered 2017-07-11: 500 mL via INTRAVENOUS

## 2017-07-11 MED ORDER — FENTANYL CITRATE (PF) 100 MCG/2ML IJ SOLN: 50.0000 ug | INTRAMUSCULAR | Status: DC | PRN

## 2017-07-11 MED ORDER — LABETALOL HCL 200 MG PO TABS
200.0000 mg | ORAL_TABLET | Freq: Three times a day (TID) | ORAL | Status: DC
  Administered 2017-07-11 – 2017-07-13 (×6): 200 mg via ORAL
  Filled 2017-07-11 (×6): qty 1

## 2017-07-11 MED ORDER — PENICILLIN G POT IN DEXTROSE 60000 UNIT/ML IV SOLN
3.0000 10*6.[IU] | INTRAVENOUS | Status: DC
  Administered 2017-07-11: 3 10*6.[IU] via INTRAVENOUS
  Filled 2017-07-11 (×6): qty 50

## 2017-07-11 MED ORDER — TETANUS-DIPHTH-ACELL PERTUSSIS 5-2.5-18.5 LF-MCG/0.5 IM SUSP
0.5000 mL | Freq: Once | INTRAMUSCULAR | Status: AC
  Administered 2017-07-13: 0.5 mL via INTRAMUSCULAR
  Filled 2017-07-11: qty 0.5

## 2017-07-11 MED ORDER — ONDANSETRON HCL 4 MG/2ML IJ SOLN: 4.0000 mg | Freq: Four times a day (QID) | INTRAMUSCULAR | Status: DC | PRN

## 2017-07-11 MED ORDER — FENTANYL 2.5 MCG/ML BUPIVACAINE 1/10 % EPIDURAL INFUSION (WH - ANES)
14.0000 mL/h | INTRAMUSCULAR | Status: DC | PRN
  Administered 2017-07-11: 14 mL/h via EPIDURAL
  Filled 2017-07-11: qty 100

## 2017-07-11 MED ORDER — OXYCODONE-ACETAMINOPHEN 5-325 MG PO TABS: 1.0000 | ORAL_TABLET | ORAL | Status: DC | PRN

## 2017-07-11 MED ORDER — LIDOCAINE HCL (PF) 1 % IJ SOLN
INTRAMUSCULAR | Status: DC | PRN
  Administered 2017-07-11 (×2): 5 mL

## 2017-07-11 MED ORDER — DIPHENHYDRAMINE HCL 50 MG/ML IJ SOLN: 12.5000 mg | INTRAMUSCULAR | Status: DC | PRN

## 2017-07-11 MED ORDER — LACTATED RINGERS IV SOLN
500.0000 mL | INTRAVENOUS | Status: DC | PRN
  Administered 2017-07-11: 500 mL via INTRAVENOUS

## 2017-07-11 MED ORDER — COCONUT OIL OIL: 1.0000 "application " | TOPICAL_OIL | Status: DC | PRN

## 2017-07-11 MED ORDER — LABETALOL HCL 5 MG/ML IV SOLN
20.0000 mg | INTRAVENOUS | Status: DC | PRN
  Administered 2017-07-11: 20 mg via INTRAVENOUS

## 2017-07-11 MED ORDER — SENNOSIDES-DOCUSATE SODIUM 8.6-50 MG PO TABS
2.0000 | ORAL_TABLET | ORAL | Status: DC
  Administered 2017-07-11: 2 via ORAL
  Filled 2017-07-11 (×2): qty 2

## 2017-07-11 MED ORDER — SIMETHICONE 80 MG PO CHEW: 80.0000 mg | CHEWABLE_TABLET | ORAL | Status: DC | PRN

## 2017-07-11 MED ORDER — LIDOCAINE HCL (PF) 1 % IJ SOLN
30.0000 mL | INTRAMUSCULAR | Status: DC | PRN
  Filled 2017-07-11: qty 30

## 2017-07-11 MED ORDER — ACETAMINOPHEN 325 MG PO TABS: 650.0000 mg | ORAL_TABLET | ORAL | Status: DC | PRN

## 2017-07-11 MED ORDER — HYDRALAZINE HCL 20 MG/ML IJ SOLN: 10.0000 mg | Freq: Once | INTRAMUSCULAR | Status: DC | PRN

## 2017-07-11 MED ORDER — DIBUCAINE 1 % RE OINT: 1.0000 "application " | TOPICAL_OINTMENT | RECTAL | Status: DC | PRN

## 2017-07-11 MED ORDER — EPHEDRINE 5 MG/ML INJ
10.0000 mg | INTRAVENOUS | Status: DC | PRN
  Filled 2017-07-11: qty 2

## 2017-07-11 MED ORDER — TERBUTALINE SULFATE 1 MG/ML IJ SOLN
0.2500 mg | Freq: Once | INTRAMUSCULAR | Status: DC | PRN
  Filled 2017-07-11: qty 1

## 2017-07-11 MED ORDER — PHENYLEPHRINE 40 MCG/ML (10ML) SYRINGE FOR IV PUSH (FOR BLOOD PRESSURE SUPPORT)
80.0000 ug | PREFILLED_SYRINGE | INTRAVENOUS | Status: DC | PRN
  Administered 2017-07-11: 80 ug via INTRAVENOUS
  Filled 2017-07-11: qty 5

## 2017-07-11 MED ORDER — LABETALOL HCL 5 MG/ML IV SOLN
INTRAVENOUS | Status: AC
  Filled 2017-07-11: qty 4

## 2017-07-11 MED ORDER — WITCH HAZEL-GLYCERIN EX PADS: 1.0000 "application " | MEDICATED_PAD | CUTANEOUS | Status: DC | PRN

## 2017-07-11 MED ORDER — ONDANSETRON HCL 4 MG PO TABS: 4.0000 mg | ORAL_TABLET | ORAL | Status: DC | PRN

## 2017-07-11 MED ORDER — OXYTOCIN 40 UNITS IN LACTATED RINGERS INFUSION - SIMPLE MED
1.0000 m[IU]/min | INTRAVENOUS | Status: DC
  Administered 2017-07-11: 2 m[IU]/min via INTRAVENOUS
  Filled 2017-07-11: qty 1000

## 2017-07-11 MED ORDER — ACETAMINOPHEN 325 MG PO TABS
650.0000 mg | ORAL_TABLET | ORAL | Status: DC | PRN
  Administered 2017-07-12: 650 mg via ORAL
  Filled 2017-07-11: qty 2

## 2017-07-11 MED ORDER — OXYTOCIN 40 UNITS IN LACTATED RINGERS INFUSION - SIMPLE MED: 2.5000 [IU]/h | INTRAVENOUS | Status: DC

## 2017-07-11 MED ORDER — DEXTROSE 5 % IV SOLN
5.0000 10*6.[IU] | Freq: Once | INTRAVENOUS | Status: AC
  Administered 2017-07-11: 5 10*6.[IU] via INTRAVENOUS
  Filled 2017-07-11: qty 5

## 2017-07-11 MED ORDER — LACTATED RINGERS IV SOLN
500.0000 mL | Freq: Once | INTRAVENOUS | Status: AC
  Administered 2017-07-11: 500 mL via INTRAVENOUS

## 2017-07-11 MED ORDER — IBUPROFEN 600 MG PO TABS
600.0000 mg | ORAL_TABLET | Freq: Four times a day (QID) | ORAL | Status: DC
  Filled 2017-07-11: qty 1

## 2017-07-11 MED ORDER — IBUPROFEN 100 MG/5ML PO SUSP
600.0000 mg | Freq: Four times a day (QID) | ORAL | Status: DC
  Administered 2017-07-11 – 2017-07-13 (×8): 600 mg via ORAL
  Filled 2017-07-11 (×12): qty 30

## 2017-07-11 MED ORDER — OXYCODONE-ACETAMINOPHEN 5-325 MG PO TABS: 2.0000 | ORAL_TABLET | ORAL | Status: DC | PRN

## 2017-07-11 MED ORDER — PHENYLEPHRINE 40 MCG/ML (10ML) SYRINGE FOR IV PUSH (FOR BLOOD PRESSURE SUPPORT)
80.0000 ug | PREFILLED_SYRINGE | INTRAVENOUS | Status: DC | PRN
  Filled 2017-07-11: qty 10
  Filled 2017-07-11: qty 5
  Filled 2017-07-11: qty 10

## 2017-07-11 MED ORDER — ONDANSETRON HCL 4 MG/2ML IJ SOLN: 4.0000 mg | INTRAMUSCULAR | Status: DC | PRN

## 2017-07-11 MED ORDER — BENZOCAINE-MENTHOL 20-0.5 % EX AERO
1.0000 "application " | INHALATION_SPRAY | CUTANEOUS | Status: DC | PRN
  Administered 2017-07-11: 1 via TOPICAL
  Filled 2017-07-11: qty 56

## 2017-07-11 MED ORDER — SOD CITRATE-CITRIC ACID 500-334 MG/5ML PO SOLN: 30.0000 mL | ORAL | Status: DC | PRN

## 2017-07-11 MED ORDER — DIPHENHYDRAMINE HCL 25 MG PO CAPS: 25.0000 mg | ORAL_CAPSULE | Freq: Four times a day (QID) | ORAL | Status: DC | PRN

## 2017-07-11 MED ORDER — LACTATED RINGERS IV SOLN
INTRAVENOUS | Status: DC
  Administered 2017-07-11 (×2): via INTRAVENOUS

## 2017-07-11 MED ORDER — PRENATAL MULTIVITAMIN CH
1.0000 | ORAL_TABLET | Freq: Every day | ORAL | Status: DC
  Filled 2017-07-11: qty 1

## 2017-07-11 NOTE — Anesthesia Preprocedure Evaluation (Signed)
Anesthesia Evaluation  Patient identified by MRN, date of birth, ID band Patient awake    Reviewed: Allergy & Precautions, H&P , NPO status , Patient's Chart, lab work & pertinent test results  History of Anesthesia Complications Negative for: history of anesthetic complications  Airway Mallampati: II  TM Distance: >3 FB Neck ROM: full    Dental no notable dental hx. (+) Teeth Intact   Pulmonary neg pulmonary ROS,    Pulmonary exam normal breath sounds clear to auscultation       Cardiovascular hypertension (PIH), Normal cardiovascular exam Rhythm:regular Rate:Normal     Neuro/Psych negative neurological ROS  negative psych ROS   GI/Hepatic negative GI ROS, Neg liver ROS,   Endo/Other  negative endocrine ROS  Renal/GU negative Renal ROS  negative genitourinary   Musculoskeletal   Abdominal   Peds  Hematology negative hematology ROS (+)   Anesthesia Other Findings   Reproductive/Obstetrics (+) Pregnancy                             Anesthesia Physical Anesthesia Plan  ASA: II  Anesthesia Plan: Epidural   Post-op Pain Management:    Induction:   PONV Risk Score and Plan:   Airway Management Planned:   Additional Equipment:   Intra-op Plan:   Post-operative Plan:   Informed Consent: I have reviewed the patients History and Physical, chart, labs and discussed the procedure including the risks, benefits and alternatives for the proposed anesthesia with the patient or authorized representative who has indicated his/her understanding and acceptance.     Plan Discussed with:   Anesthesia Plan Comments:         Anesthesia Quick Evaluation

## 2017-07-11 NOTE — Anesthesia Postprocedure Evaluation (Signed)
Anesthesia Post Note  Patient: Whitney Morrison  Procedure(s) Performed: AN AD HOC LABOR EPIDURAL     Patient location during evaluation: Mother Baby Anesthesia Type: Epidural Level of consciousness: awake and alert and oriented Pain management: satisfactory to patient Vital Signs Assessment: post-procedure vital signs reviewed and stable Respiratory status: spontaneous breathing and nonlabored ventilation Cardiovascular status: stable Postop Assessment: no headache, no backache, no signs of nausea or vomiting, adequate PO intake and patient able to bend at knees (patient up walking) Anesthetic complications: no    Last Vitals:  Vitals:   07/11/17 1215 07/11/17 1315  BP: (!) 145/84 (!) 147/81  Pulse: 96 (!) 102  Resp: 18 18  Temp: 37.6 C (!) 36 C  SpO2: 98% 100%    Last Pain:  Vitals:   07/11/17 1315  TempSrc: Oral  PainSc:    Pain Goal:                 [name]

## 2017-07-11 NOTE — Anesthesia Procedure Notes (Signed)
Epidural Patient location during procedure: OB  Staffing Anesthesiologist: Camile Esters, MD Performed: anesthesiologist   Preanesthetic Checklist Completed: patient identified, site marked, surgical consent, pre-op evaluation, timeout performed, IV checked, risks and benefits discussed and monitors and equipment checked  Epidural Patient position: sitting Prep: DuraPrep Patient monitoring: heart rate, continuous pulse ox and blood pressure Approach: right paramedian Location: L3-L4 Injection technique: LOR saline  Needle:  Needle type: Tuohy  Needle gauge: 17 G Needle length: 9 cm and 9 Needle insertion depth: 5 cm Catheter type: closed end flexible Catheter size: 20 Guage Catheter at skin depth: 9 cm Test dose: negative  Assessment Events: blood not aspirated, injection not painful, no injection resistance, negative IV test and no paresthesia  Additional Notes Patient identified. Risks/Benefits/Options discussed with patient including but not limited to bleeding, infection, nerve damage, paralysis, failed block, incomplete pain control, headache, blood pressure changes, nausea, vomiting, reactions to medication both or allergic, itching and postpartum back pain. Confirmed with bedside nurse the patient's most recent platelet count. Confirmed with patient that they are not currently taking any anticoagulation, have any bleeding history or any family history of bleeding disorders. Patient expressed understanding and wished to proceed. All questions were answered. Sterile technique was used throughout the entire procedure. Please see nursing notes for vital signs. Test dose was given through epidural needle and negative prior to continuing to dose epidural or start infusion. Warning signs of high block given to the patient including shortness of breath, tingling/numbness in hands, complete motor block, or any concerning symptoms with instructions to call for help. Patient was given  instructions on fall risk and not to get out of bed. All questions and concerns addressed with instructions to call with any issues.     

## 2017-07-11 NOTE — Progress Notes (Signed)
Labor Progress Note Whitney Morrison is a 37 y.o. G3P1101 at [redacted]w[redacted]d presented for midnight IOL for cHTN and previous IUFD S: No complaints  O:  BP (!) 146/95   Pulse 79   Temp 97.9 F (36.6 C) (Oral)   Resp 18   LMP 10/09/2016   SpO2 99%  EFM: 130 bpm/mod var/no decels  CVE: Dilation: 4 Effacement (%): 50 Station: -2 Presentation: Vertex Exam by:: Merri Brunette, RN   A&P: 37 y.o. [phone] [redacted]w[redacted]d here for IOL for cHTN and previous IUFD #Labor: not in labor yet. Continue pitocin. Likely AROM at next check. #cHTN: had one episode of severe range blood pressure but this was around time when patient was moving about the room. Was given single dose labetalol. Blood pressure has been elevated but not in severe range since that time. [place] labs wnl. Continue to monitor closely.   Dannielle Huh, DO 5:53 AM

## 2017-07-11 NOTE — Progress Notes (Signed)
Verbal order received from Dr. Manus Rudd to give pt a dose of phenylephrine as pt's BP has significantly dropped from her baseline.

## 2017-07-12 ENCOUNTER — Encounter: Payer: Self-pay | Admitting: *Deleted

## 2017-07-12 LAB — COMPREHENSIVE METABOLIC PANEL
ALK PHOS: 101 U/L (ref 38–126)
ALT: 10 U/L — ABNORMAL LOW (ref 14–54)
AST: 15 U/L (ref 15–41)
Albumin: 2.6 g/dL — ABNORMAL LOW (ref 3.5–5.0)
Anion gap: 6 (ref 5–15)
BILIRUBIN TOTAL: 0.3 mg/dL (ref 0.3–1.2)
BUN: 8 mg/dL (ref 6–20)
CALCIUM: 8.4 mg/dL — AB (ref 8.9–10.3)
CHLORIDE: 105 mmol/L (ref 101–111)
CO2: 23 mmol/L (ref 22–32)
CREATININE: 0.59 mg/dL (ref 0.44–1.00)
GFR calc Af Amer: >60 mL/min (ref >60)
Glucose, Bld: 89 mg/dL (ref 65–99)
Potassium: 4.2 mmol/L (ref 3.5–5.1)
Sodium: 134 mmol/L — ABNORMAL LOW (ref 135–145)
Total Protein: 6.4 g/dL — ABNORMAL LOW (ref 6.5–8.1)

## 2017-07-12 LAB — CBC
HEMATOCRIT: 29.1 % — AB (ref 36.0–46.0)
HEMOGLOBIN: 9.9 g/dL — AB (ref 12.0–15.0)
MCH: 27.8 pg (ref 26.0–34.0)
MCHC: 34 g/dL (ref 30.0–36.0)
MCV: 81.7 fL (ref 78.0–100.0)
PLATELETS: 215 10*3/uL (ref 150–400)
RBC: 3.56 MIL/uL — AB (ref 3.87–5.11)
RDW: 14.1 % (ref 11.5–15.5)
WBC: 7.3 10*3/uL (ref 4.0–10.5)

## 2017-07-12 MED ORDER — OXYCODONE-ACETAMINOPHEN 5-325 MG PO TABS
1.0000 | ORAL_TABLET | Freq: Once | ORAL | Status: AC
  Administered 2017-07-12: 1 via ORAL
  Filled 2017-07-12 (×2): qty 1

## 2017-07-12 NOTE — Progress Notes (Signed)
md notified regarding Headache.  MD to bedside

## 2017-07-12 NOTE — Progress Notes (Signed)
POSTPARTUM PROGRESS NOTE  Post Partum Day 1  Subjective:  Whitney Morrison is a 37 y.o. [id number] s/p SVD after IOL for chronic HTN at [redacted]w[redacted]d.  No acute events overnight.  Pt denies problems with ambulating, voiding or po intake.  She denies nausea or vomiting.  She reports no headaches, dizziness, or changes in vision. Pain is moderately controlled.  She has not had flatus. She has not had bowel movement.  Lochia Minimal.   Objective: Blood pressure 127/82, pulse 94, temperature 98.1 F (36.7 C), temperature source Oral, resp. rate 18, weight 97.9 kg (215 lb 12.8 oz), last menstrual period 10/09/2016, SpO2 100 %, unknown if currently breastfeeding.  Physical Exam:  General: alert, cooperative and no distress Chest: no respiratory distress Heart:regular rate, distal pulses intact Abdomen: soft, nontender Uterine Fundus: firm, appropriately tender DVT Evaluation: No calf swelling or tenderness Extremities: no peripheral edema edema   Recent Labs    07/11/17 0135 07/11/17 1040  HGB 10.8* 10.6*  HCT 31.8* 31.5*    Assessment/Plan: Kristena Fetting is a 37 y.o. C5E5277 s/p SVD after IOL for chronic HTN at [redacted]w[redacted]d.  PPD#1 - Doing well.  BP controlled with labetalol  Contraception: undecided still Feeding: breast   Dispo: Plan for discharge tomorrow if blood pressure remains under good control.   LOS: 1 day   Brendolyn Patty MS3 07/12/2017, 7:58 AM

## 2017-07-13 NOTE — Progress Notes (Signed)
POSTPARTUM PROGRESS NOTE  Post Partum Day #2  Subjective: Whitney Morrison is a 37 y.o. I3B0488 s/p SVD at [redacted]w[redacted]d.  No acute events overnight.  Pt denies problems with ambulating, voiding or po intake.  She denies nausea or vomiting.  Pain is well controlled.  She has had flatus. She has not had bowel movement.  Lochia Small.   Objective: Blood pressure (!) 129/92, pulse 88, temperature (!) 97.3 F (36.3 C), temperature source Oral, resp. rate 16, weight 99.2 kg (218 lb 12.8 oz), last menstrual period 10/09/2016, SpO2 98 %, unknown if currently breastfeeding.  Physical Exam:  General: Alert, cooperative and no distress Skin: Warm, and dry Heart: Regular rate and rhythm, distal pulses intact Lungs: No respiratory distress, CTAB without wheezing or rales. Abdomen: soft, nontender, +BS Uterine Fundus: firm, appropriately tender Incision: clean/dry/intact DVT Evaluation: No calf swelling or tenderness Extremities: trace edema  Recent Labs    07/11/17 1040 07/12/17 1836  HGB 10.6* 9.9*  HCT 31.5* 29.1*    Assessment/Plan: Dennis Rider is a 37 y.o. Q9V6945 s/p SVD at [redacted]w[redacted]d   PPD#2 - Doing well  Contraception: undecided Feeding: breast Dispo: Plan for discharge: DC today.   LOS: 2 days   Jarvis Newcomer 07/13/2017, 7:38 AM

## 2017-07-13 NOTE — Lactation Note (Signed)
This note was copied from a baby's chart. Lactation Consultation Note  Patient Name: Whitney Morrison Today's Date: 07/13/2017 Reason for consult: Follow-up assessment Baby latches to right breast easily and well.  Observed active feeding with good swallows.  Breast is filling.  Mom worried she won't make enough milk with one breast.  Reassured mom that  it is possible with demand being greater.  Mom will feed with cues and offer formula if baby still acting hungry .  Instructed to keep a feeding diary at home and normal voids and stools reviewed.  Baby has a weight check tomorrow.  Manual pump given with instructions on use. Her son is 5 years old and she doesn't remember what her supply was with him.  Surgery was one year after his birth.  Lactation outpatient services reviewed and encouraged prn. Maternal Data    Feeding Feeding Type: Breast Fed  LATCH Score Latch: Grasps breast easily, tongue down, lips flanged, rhythmical sucking.  Audible Swallowing: Spontaneous and intermittent  Type of Nipple: Everted at rest and after stimulation  Comfort (Breast/Nipple): Soft / non-tender  Hold (Positioning): No assistance needed to correctly position infant at breast.  LATCH Score: 10  Interventions    Lactation Tools Discussed/Used     Consult Status Consult Status: Complete Date: 07/14/17 Follow-up type: In-patient    Ave Filter 07/13/2017, 10:11 AM

## 2017-07-13 NOTE — Discharge Summary (Signed)
OB Discharge Summary     Patient Name: Whitney Morrison DOB: 18-Nov-1980 MRN: [id number] Date of admission: 07/11/2017  Delivering MD: De Hollingshead )  Date of discharge: 07/13/2017    Admitting diagnosis: cHTN, history of IUFD, induction o flabor Intrauterine pregnancy: [redacted]w[redacted]d    Secondary diagnosis:  Active Problems:   Patient Active Problem List   Diagnosis Date Noted  . Chronic hypertension affecting pregnancy 07/11/2017  . Vaginal delivery 07/11/2017  . Group B streptococcal carriage complicating pregnancy 42/35/3614  . Supervision of high risk pregnancy, antepartum 06/21/2017  . Chronic hypertension during pregnancy, antepartum 06/21/2017  . History of breast cancer 06/21/2017  . History of IUFD 06/21/2017    Additional problems: left arm lymphadema     Discharge diagnosis: Term Pregnancy Delivered and CHTN                                                                                                Post partum procedures:none  Complications: None  Hospital course:  Induction of Labor With Vaginal Delivery   37 y.o. yo E3X5400 at [redacted]w[redacted]d was admitted to the hospital 07/11/2017 for induction of labor.  Indication for induction: cHTN.  Patient had an uncomplicated labor course as follows: Membrane Rupture Time/Date: 9:41 AM ,07/11/2017   Intrapartum Procedures: Episiotomy: None [1]                                         Lacerations:     Patient had delivery of a Viable infant.  Information for the patient's newborn:  Tilia, Faso [[id number]]  Delivery Method: Vaginal, Spontaneous(Filed from Delivery Summary)   07/11/2017  Details of delivery can be found in separate delivery note.  Patient had a routine postpartum course. Diastolic blood pressure mildly elevated on day of discharge 129/92. Discharge home on labetalol 200 mg TID.  Patient is discharged home 07/13/17.  Physical exam  Vitals:   07/12/17 2228 07/13/17 0525  BP: (!) 132/91 (!) 129/92  Pulse: 87 88  Resp:  16   Temp:  (!) 97.3 F (36.3 C)  SpO2:      General: alert, cooperative and no distress Lochia: appropriate Uterine Fundus: firm Incision: N/A DVT Evaluation: No evidence of DVT seen on physical exam.  Labs: Results for orders placed or performed during the hospital encounter of 07/11/17 (from the past 24 hour(s))  CBC     Status: Abnormal   Collection Time: 07/12/17  6:36 PM  Result Value Ref Range   WBC 7.3 4.0 - 10.5 K/uL   RBC 3.56 (L) 3.87 - 5.11 MIL/uL   Hemoglobin 9.9 (L) 12.0 - 15.0 g/dL   HCT 29.1 (L) 36.0 - 46.0 %   MCV 81.7 78.0 - 100.0 fL   MCH 27.8 26.0 - 34.0 pg   MCHC 34.0 30.0 - 36.0 g/dL   RDW 14.1 11.5 - 15.5 %   Platelets 215 150 - 400 K/uL  Comprehensive metabolic panel     Status: Abnormal   Collection Time: 07/12/17  6:36 PM  Result Value Ref Range   Sodium 134 (L) 135 - 145 mmol/L   Potassium 4.2 3.5 - 5.1 mmol/L   Chloride 105 101 - 111 mmol/L   CO2 23 22 - 32 mmol/L   Glucose, Bld 89 65 - 99 mg/dL   BUN 8 6 - 20 mg/dL   Creatinine, Ser 0.59 0.44 - 1.00 mg/dL   Calcium 8.4 (L) 8.9 - 10.3 mg/dL   Total Protein 6.4 (L) 6.5 - 8.1 g/dL   Albumin 2.6 (L) 3.5 - 5.0 g/dL   AST 15 15 - 41 U/L   ALT 10 (L) 14 - 54 U/L   Alkaline Phosphatase 101 38 - 126 U/L   Total Bilirubin 0.3 0.3 - 1.2 mg/dL   GFR calc non Af Amer >60 >60 mL/min   GFR calc Af Amer >60 >60 mL/min   Anion gap 6 5 - 15     Discharge instruction: per After Visit Summary and "Baby and Me Booklet".  After visit meds:  No Known Allergies  Allergies as of 07/13/2017   No Known Allergies     Medication List    STOP taking these medications   calcium carbonate 500 MG chewable tablet Commonly known as:  TUMS - dosed in mg elemental calcium     TAKE these medications   acetaminophen 325 MG tablet Commonly known as:  TYLENOL Take 650 mg by mouth every 6 (six) hours as needed for mild pain or headache.   labetalol 200 MG tablet Commonly known as:  NORMODYNE Take 1 tablet (200 mg  total) by mouth 3 (three) times daily.   prenatal vitamin w/FE, FA 27-1 MG Tabs tablet Take 1 tablet by mouth daily at 12 noon.        Diet: routine diet  Activity: Advance as tolerated. Pelvic rest for 6 weeks.   Outpatient follow up:4 weeks  Placed referral for lymphadema clinic and breast clinic since patient has not had follow up from breast cancer in 2010 since moving to [place].  Future Appointments:  Future Appointments  Date Time Provider [place]  07/18/2017 11:00 AM [place]  08/26/2017  3:15 PM Jorje Guild, NP [facility]    Follow up Appt: No Follow-up on file.     Postpartum contraception: Undecided  Newborn Data: APGAR (1 MIN): 9   APGAR (5 MINS): 9     Baby Feeding: Breast Disposition:home with mother  Dannielle Huh, DO  07/13/2017

## 2017-07-13 NOTE — Lactation Note (Signed)
This note was copied from a baby's chart. Lactation Consultation Note  Patient Name: Whitney Morrison Today's Date: 07/13/2017 Reason for consult: Initial assessment;Term;Other (Comment);Infant < 6lbs(Hx of cancer. Lumpectomy left breast) Baby is 36 hours old and at a 3% weight loss.  Mom is feeding from only right breast due to surgery on left.  Explained to mom she may produce some on left.  Mom states she breastfeeds and bottle feeds alternate feedings.  She has pumped once and obtained 5 mls from right side.  Instructed to call for feeding assessment with next feeding.  Maternal Data    Feeding Feeding Type: Bottle Fed - Formula Nipple Type: Slow - flow  LATCH Score                   Interventions    Lactation Tools Discussed/Used     Consult Status Consult Status: Follow-up Date: 07/14/17 Follow-up type: In-patient    Ave Filter 07/13/2017, 8:48 AM

## 2017-07-14 ENCOUNTER — Encounter (HOSPITAL_COMMUNITY): Payer: Self-pay | Admitting: Emergency Medicine

## 2017-07-14 ENCOUNTER — Inpatient Hospital Stay (HOSPITAL_COMMUNITY)
Admission: AD | Admit: 2017-07-14 | Discharge: 2017-07-14 | Disposition: A | Discharge status: Home / Self Care | Payer: Medicaid Other | Source: Ambulatory Visit | Attending: Obstetrics & Gynecology | Admitting: Obstetrics & Gynecology

## 2017-07-14 DIAGNOSIS — O1205 Gestational edema, complicating the puerperium: Secondary | ICD-10-CM

## 2017-07-14 DIAGNOSIS — Z853 Personal history of malignant neoplasm of breast: Secondary | ICD-10-CM | POA: Insufficient documentation

## 2017-07-14 DIAGNOSIS — O9089 Other complications of the puerperium, not elsewhere classified: Secondary | ICD-10-CM | POA: Insufficient documentation

## 2017-07-14 DIAGNOSIS — Z79899 Other long term (current) drug therapy: Secondary | ICD-10-CM | POA: Insufficient documentation

## 2017-07-14 DIAGNOSIS — I10 Essential (primary) hypertension: Secondary | ICD-10-CM | POA: Insufficient documentation

## 2017-07-14 DIAGNOSIS — M7989 Other specified soft tissue disorders: Secondary | ICD-10-CM | POA: Insufficient documentation

## 2017-07-14 MED ORDER — IBUPROFEN 800 MG PO TABS
800.0000 mg | ORAL_TABLET | Freq: Once | ORAL | Status: AC
  Administered 2017-07-14: 800 mg via ORAL
  Filled 2017-07-14: qty 1

## 2017-07-14 MED ORDER — IBUPROFEN 800 MG PO TABS: 800.0000 mg | ORAL_TABLET | Freq: Three times a day (TID) | ORAL | 0 refills | Status: AC

## 2017-07-14 NOTE — MAU Provider Note (Signed)
History     CSN: [id number]  Arrival date and time: 07/14/17 2220   First Provider Initiated Contact with Patient 07/14/17 2250     Chief Complaint  Patient presents with  . Leg Swelling   HPI Whitney Morrison is a 37 y.o. X6I6803 postpartum from a vaginal delivery on 2/4 who presents with lower extremity swelling and back soreness. She states she had no swelling during the pregnancy until today. She also reports soreness in her back that started on 2/5 while she was in the hospital. She has not tried anything for the pain. She reports a small amount of vaginal bleeding.   OB History    Gravida Para Term Preterm AB Living   3 3 2 1  0 2   SAB TAB Ectopic Multiple Live Births         0 2      Obstetric Comments   G2: IUFD ~7 months per patient      Past Medical History:  Diagnosis Date  . Breast cancer ([place])   . Hypertension   . Lymph edema   . Pregnancy induced hypertension    during her 2005 pregnancy and ever since    Past Surgical History:  Procedure Laterality Date  . BREAST SURGERY      Family History  Problem Relation Age of Onset  . Diabetes Father   . Hypertension Maternal Grandmother     Social History   Tobacco Use  . Smoking status: Never Smoker  . Smokeless tobacco: Never Used  Substance Use Topics  . Alcohol use: No    Frequency: Never  . Drug use: No    Allergies: No Known Allergies  Medications Prior to Admission  Medication Sig Dispense Refill Last Dose  . acetaminophen (TYLENOL) 325 MG tablet Take 650 mg by mouth every 6 (six) hours as needed for mild pain or headache.   Past Week at Unknown time  . labetalol (NORMODYNE) 200 MG tablet Take 1 tablet (200 mg total) by mouth 3 (three) times daily. 180 tablet 1 07/11/2017 at Unknown time  . prenatal vitamin w/FE, FA (PRENATAL 1 + 1) 27-1 MG TABS tablet Take 1 tablet by mouth daily at 12 noon.   07/10/2017 at Unknown time    Review of Systems  Constitutional: Negative.  Negative for fatigue and  fever.  HENT: Negative.   Respiratory: Negative.  Negative for shortness of breath.   Cardiovascular: Negative.  Negative for chest pain.  Gastrointestinal: Negative.  Negative for abdominal pain, constipation, diarrhea, nausea and vomiting.  Genitourinary: Negative.  Negative for dysuria.  Musculoskeletal: Positive for back pain.       Leg swelling  Neurological: Negative.  Negative for dizziness and headaches.   Physical Exam   Blood pressure 137/87, pulse 96, temperature 98.4 F (36.9 C), temperature source Oral, resp. rate 18, height 5\' 3"  (1.6 m), weight 219 lb 8 oz (99.6 kg), last menstrual period 10/09/2016, unknown if currently breastfeeding.  Physical Exam  Nursing note and vitals reviewed. Constitutional: She is oriented to person, place, and time. She appears well-developed and well-nourished. No distress.  HENT:  Head: Normocephalic.  Eyes: Pupils are equal, round, and reactive to light.  Cardiovascular: Normal rate, regular rhythm and normal heart sounds.  Respiratory: Effort normal and breath sounds normal. No respiratory distress.  GI: Soft. Bowel sounds are normal. She exhibits no distension. There is no tenderness.  Musculoskeletal:       Right lower leg: She exhibits no tenderness, no  swelling and no edema.       Left lower leg: She exhibits no tenderness, no swelling and no edema.       Right foot: There is no swelling.       Left foot: There is no swelling.  Minimal swelling in calves, symmetrical in appearance. No redness or tenderness in calves. Negative Homans sign bilaterally.   Neurological: She is alert and oriented to person, place, and time.  Skin: Skin is warm and dry.  Psychiatric: She has a normal mood and affect. Her behavior is normal. Judgment and thought content normal.   MAU Course  Procedures  MDM Ibuprofen 800mg  PO- patient reports relief from pain, requesting discharge Low suspicion for DVT due to lack of redness, tenderness and negative  Homan's sign.  Reassurance provided of normalcy of swelling postpartum. No tenderness in abdomen or back- low suspicion for any acute processes.  Assessment and Plan   1. Postpartum edema    -Discharge home in stable condition -Rx for ibuprofen given to patient -Patient advised to follow-up with [facility] on 2/11 for her BP check -Patient may return to MAU as needed or if her condition were to change or worsen   Wende Mott CNM 07/14/2017, 10:58 PM

## 2017-07-14 NOTE — MAU Note (Signed)
PT SAYS SHE DEL BABY ON Monday - VAG.   SAYS LEGS  ARE SWOLLEN - SAYS NONE DURING PREG.      BACK PAIN AND ABD  PAIN  STARTED Tuesday.    BREAST/ BOTTLE FEEDING

## 2017-07-14 NOTE — Discharge Instructions (Signed)

## 2017-07-18 ENCOUNTER — Ambulatory Visit: Payer: Medicaid Other | Admitting: *Deleted

## 2017-07-18 VITALS — BP 131/99 | HR 91

## 2017-07-18 DIAGNOSIS — I10 Essential (primary) hypertension: Secondary | ICD-10-CM

## 2017-07-18 MED ORDER — AMLODIPINE BESYLATE 5 MG PO TABS: 5.0000 mg | ORAL_TABLET | Freq: Every day | ORAL | 0 refills | Status: DC

## 2017-07-18 NOTE — Progress Notes (Signed)
I have reviewed the chart and agree with nursing staff's documentation of this patient's encounter.  Kerry Hough, PA-C 07/18/2017 1:29 PM

## 2017-07-18 NOTE — Progress Notes (Signed)
Here for bp check. States her edema is about the same as 07/14/17.Edema noed at ankles /feet =1+. States she still has lower back pain and pelvic cramping. We discussed is normal to still have cramps and back may be sore from epidural. States she has taken any more ibuprofen since 2/7. I recommended she take ibuprofen every 6 hours as needed and that wiith time cramps and back pain should get better. States she is taking the labetolol TID and took it once today. BP elevated today. Also states she was supposed to be called about a referral for her arm edema ( history breast cancer). I do see  a referral was made for breast surgery. [place] [facility] and was told may call [phone][facility].  I called and left a message I have a referral. Per discussion with Kerry Hough, PA and instructed patient to stop taking labetolol and start norvasc 5mg  daily and come back one week for bp check. She voices understanding. Also discussed with her meds to take for her cough.

## 2017-07-20 ENCOUNTER — Inpatient Hospital Stay (HOSPITAL_COMMUNITY)
Admission: AD | Admit: 2017-07-20 | Discharge: 2017-07-20 | Disposition: A | Discharge status: Home / Self Care | Payer: Medicaid Other | Source: Ambulatory Visit | Attending: Obstetrics and Gynecology | Admitting: Obstetrics and Gynecology

## 2017-07-20 DIAGNOSIS — O9089 Other complications of the puerperium, not elsewhere classified: Secondary | ICD-10-CM | POA: Diagnosis not present

## 2017-07-20 DIAGNOSIS — I10 Essential (primary) hypertension: Secondary | ICD-10-CM

## 2017-07-20 DIAGNOSIS — O1003 Pre-existing essential hypertension complicating the puerperium: Secondary | ICD-10-CM | POA: Insufficient documentation

## 2017-07-20 DIAGNOSIS — R519 Headache, unspecified: Secondary | ICD-10-CM

## 2017-07-20 DIAGNOSIS — R51 Headache: Secondary | ICD-10-CM

## 2017-07-20 LAB — COMPREHENSIVE METABOLIC PANEL
ALK PHOS: 105 U/L (ref 38–126)
ALT: 23 U/L (ref 14–54)
AST: 21 U/L (ref 15–41)
Albumin: 3.4 g/dL — ABNORMAL LOW (ref 3.5–5.0)
Anion gap: 10 (ref 5–15)
BILIRUBIN TOTAL: 0.5 mg/dL (ref 0.3–1.2)
BUN: 13 mg/dL (ref 6–20)
CALCIUM: 8.9 mg/dL (ref 8.9–10.3)
CO2: 23 mmol/L (ref 22–32)
CREATININE: 0.76 mg/dL (ref 0.44–1.00)
Chloride: 103 mmol/L (ref 101–111)
Glucose, Bld: 100 mg/dL — ABNORMAL HIGH (ref 65–99)
Potassium: 3.8 mmol/L (ref 3.5–5.1)
Sodium: 136 mmol/L (ref 135–145)
TOTAL PROTEIN: 7.3 g/dL (ref 6.5–8.1)

## 2017-07-20 LAB — CBC
HEMATOCRIT: 36 % (ref 36.0–46.0)
Hemoglobin: 12.1 g/dL (ref 12.0–15.0)
MCH: 27.6 pg (ref 26.0–34.0)
MCHC: 33.6 g/dL (ref 30.0–36.0)
MCV: 82 fL (ref 78.0–100.0)
Platelets: 330 10*3/uL (ref 150–400)
RBC: 4.39 MIL/uL (ref 3.87–5.11)
RDW: 13.6 % (ref 11.5–15.5)
WBC: 6.3 10*3/uL (ref 4.0–10.5)

## 2017-07-20 LAB — PROTEIN / CREATININE RATIO, URINE
Creatinine, Urine: 109 mg/dL
Protein Creatinine Ratio: 0.11 mg/mg{Cre} (ref 0.00–0.15)
Total Protein, Urine: 12 mg/dL

## 2017-07-20 LAB — URINALYSIS, ROUTINE W REFLEX MICROSCOPIC
Bilirubin Urine: NEGATIVE
Glucose, UA: NEGATIVE mg/dL
Ketones, ur: NEGATIVE mg/dL
LEUKOCYTES UA: NEGATIVE
NITRITE: NEGATIVE
PH: 5 (ref 5.0–8.0)
Protein, ur: 30 mg/dL — AB
Specific Gravity, Urine: 1.02 (ref 1.005–1.030)

## 2017-07-20 MED ORDER — AMLODIPINE BESYLATE 10 MG PO TABS: 10.0000 mg | ORAL_TABLET | Freq: Every day | ORAL | 0 refills | Status: AC

## 2017-07-20 MED ORDER — LABETALOL HCL 200 MG PO TABS: 200.0000 mg | ORAL_TABLET | Freq: Two times a day (BID) | ORAL | 1 refills | Status: AC

## 2017-07-20 MED ORDER — BUTALBITAL-APAP-CAFFEINE 50-325-40 MG PO TABS
2.0000 | ORAL_TABLET | Freq: Once | ORAL | Status: DC
  Filled 2017-07-20: qty 2

## 2017-07-20 MED ORDER — IBUPROFEN 800 MG PO TABS
800.0000 mg | ORAL_TABLET | Freq: Once | ORAL | Status: AC
  Administered 2017-07-20: 800 mg via ORAL
  Filled 2017-07-20: qty 1

## 2017-07-20 NOTE — MAU Provider Note (Signed)
History     CSN: [id number]  Arrival date and time: 07/20/17 7829   None     Chief Complaint  Patient presents with  . Hypertension   HPI Whitney Morrison is a 37 y.o. F6O1308 postpartum from a vaginal delivery on 2/4 who presents for evaluation of high blood pressure. She was induced for chronic hypertension and started on labetalol postpartum. On 2/11, she was seen for her BP check and her medication was changed to 5mg  Norvasc. Today the home health nurse came to her house and she still had elevated blood pressures. She also reports a headache since starting the Norvasc that has been unrelieved with tylenol. She denies any epigastric pain or visual changes.   OB History    Gravida Para Term Preterm AB Living   3 3 2 1  0 2   SAB TAB Ectopic Multiple Live Births         0 2      Obstetric Comments   G2: IUFD ~7 months per patient      Past Medical History:  Diagnosis Date  . Breast cancer ([place])   . Hypertension   . Lymph edema   . Pregnancy induced hypertension    during her 2005 pregnancy and ever since    Past Surgical History:  Procedure Laterality Date  . BREAST SURGERY      Family History  Problem Relation Age of Onset  . Diabetes Father   . Hypertension Maternal Grandmother     Social History   Tobacco Use  . Smoking status: Never Smoker  . Smokeless tobacco: Never Used  Substance Use Topics  . Alcohol use: No    Frequency: Never  . Drug use: No    Allergies: No Known Allergies  Medications Prior to Admission  Medication Sig Dispense Refill Last Dose  . acetaminophen (TYLENOL) 325 MG tablet Take 650 mg by mouth every 6 (six) hours as needed for mild pain or headache.   07/20/2017 at Unknown time  . amLODipine (NORVASC) 5 MG tablet Take 1 tablet (5 mg total) by mouth daily. 30 tablet 0 07/20/2017 at Unknown time  . dextromethorphan (DELSYM) 30 MG/5ML liquid Take by mouth as needed for cough.   07/20/2017 at Unknown time  . ibuprofen (ADVIL,MOTRIN) 800 MG  tablet Take 1 tablet (800 mg total) by mouth 3 (three) times daily. 30 tablet 0 07/19/2017 at Unknown time  . labetalol (NORMODYNE) 200 MG tablet Take 1 tablet (200 mg total) by mouth 3 (three) times daily. 180 tablet 1 Past Week at Unknown time  . prenatal vitamin w/FE, FA (PRENATAL 1 + 1) 27-1 MG TABS tablet Take 1 tablet by mouth daily at 12 noon.   07/20/2017 at Unknown time    Review of Systems  Constitutional: Negative.  Negative for fatigue and fever.  HENT: Negative.   Eyes: Negative for visual disturbance.  Respiratory: Negative.  Negative for shortness of breath.   Cardiovascular: Negative.  Negative for chest pain.  Gastrointestinal: Negative.  Negative for abdominal pain, constipation, diarrhea, nausea and vomiting.  Genitourinary: Negative.  Negative for dysuria.  Neurological: Positive for headaches. Negative for dizziness.   Physical Exam   Blood pressure (!) 141/100, pulse 91, temperature 98.3 F (36.8 C), temperature source Oral, resp. rate 16, height 5\' 5"  (1.651 m), weight 205 lb (93 kg), unknown if currently breastfeeding.  Patient Vitals for the past 24 hrs:  BP Temp Temp src Pulse Resp Height Weight  07/20/17 2116 (!) 144/100 - -  82 - - -  07/20/17 2101 (!) 143/104 - - 82 - - -  07/20/17 2046 (!) 148/109 - - 77 - - -  07/20/17 2023 (!) 158/107 - - 92 - - -  07/20/17 1946 (!) 141/100 - - 91 - - -  07/20/17 1935 (!) 137/97 - - 96 - - -  07/20/17 1914 (!) 145/104 98.3 F (36.8 C) Oral (!) 103 16 5\' 5"  (1.651 m) 205 lb (93 kg)     Physical Exam  Nursing note and vitals reviewed. Constitutional: She is oriented to person, place, and time. She appears well-developed and well-nourished. No distress.  HENT:  Head: Normocephalic.  Eyes: Pupils are equal, round, and reactive to light.  Cardiovascular: Normal rate, regular rhythm and normal heart sounds.  Respiratory: Effort normal and breath sounds normal. No respiratory distress.  GI: Soft. Bowel sounds are normal.  She exhibits no distension. There is no tenderness.  Neurological: She is alert and oriented to person, place, and time. She has normal reflexes. She displays normal reflexes. No cranial nerve deficit. Coordination normal.  Skin: Skin is warm and dry.  Psychiatric: She has a normal mood and affect. Her behavior is normal. Judgment and thought content normal.    MAU Course  Procedures Results for orders placed or performed during the hospital encounter of 07/20/17 (from the past 24 hour(s))  Urinalysis, Routine w reflex microscopic     Status: Abnormal   Collection Time: 07/20/17  7:20 PM  Result Value Ref Range   Color, Urine YELLOW YELLOW   APPearance CLEAR CLEAR   Specific Gravity, Urine 1.020 1.005 - 1.030   pH 5.0 5.0 - 8.0   Glucose, UA NEGATIVE NEGATIVE mg/dL   Hgb urine dipstick MODERATE (A) NEGATIVE   Bilirubin Urine NEGATIVE NEGATIVE   Ketones, ur NEGATIVE NEGATIVE mg/dL   Protein, ur 30 (A) NEGATIVE mg/dL   Nitrite NEGATIVE NEGATIVE   Leukocytes, UA NEGATIVE NEGATIVE   RBC / HPF 0-5 0 - 5 RBC/hpf   WBC, UA 0-5 0 - 5 WBC/hpf   Bacteria, UA RARE (A) NONE SEEN   Squamous Epithelial / LPF 0-5 (A) NONE SEEN   Mucus PRESENT   CBC     Status: None   Collection Time: 07/20/17  8:20 PM  Result Value Ref Range   WBC 6.3 4.0 - 10.5 K/uL   RBC 4.39 3.87 - 5.11 MIL/uL   Hemoglobin 12.1 12.0 - 15.0 g/dL   HCT 36.0 36.0 - 46.0 %   MCV 82.0 78.0 - 100.0 fL   MCH 27.6 26.0 - 34.0 pg   MCHC 33.6 30.0 - 36.0 g/dL   RDW 13.6 11.5 - 15.5 %   Platelets 330 150 - 400 K/uL  Comprehensive metabolic panel     Status: Abnormal   Collection Time: 07/20/17  8:20 PM  Result Value Ref Range   Sodium 136 135 - 145 mmol/L   Potassium 3.8 3.5 - 5.1 mmol/L   Chloride 103 101 - 111 mmol/L   CO2 23 22 - 32 mmol/L   Glucose, Bld 100 (H) 65 - 99 mg/dL   BUN 13 6 - 20 mg/dL   Creatinine, Ser 0.76 0.44 - 1.00 mg/dL   Calcium 8.9 8.9 - 10.3 mg/dL   Total Protein 7.3 6.5 - 8.1 g/dL   Albumin 3.4  (L) 3.5 - 5.0 g/dL   AST 21 15 - 41 U/L   ALT 23 14 - 54 U/L   Alkaline Phosphatase 105 38 -  126 U/L   Total Bilirubin 0.5 0.3 - 1.2 mg/dL   GFR calc non Af Amer >60 >60 mL/min   GFR calc Af Amer >60 >60 mL/min   Anion gap 10 5 - 15   MDM UA CBC, CMP, protein/creat Ibuprofen- patient reports relief from headache  Reviewed results with Dr. Glo Herring- will increase Norvasc to 10mg  a day and add labetalol 200mg  BID  Assessment and Plan   1. Elevated blood pressure reading with diagnosis of hypertension   2. Postpartum headache    -Discharge home in stable condition -Rx for Norvasc 10mg  and labetalol given to patient -Preeclampsia precautions discussed -Patient advised to follow-up with [facility] on 2/19 as scheduled for a BP check -Patient may return to MAU as needed or if her condition were to change or worsen   [place] 07/20/2017, 8:54 PM

## 2017-07-20 NOTE — MAU Note (Signed)
Pt delivered on 11 Jul 2017 . Pt was seen at her ob office on Monday and was told her BP was high and the doctor switched her labetalol to amlodipine 5mg . She states since she started taking that drug, she has not stopped having a headache. She tried 2 tylenol with no relief. A home visiting nurse came to see her today and noticed her BP was till high and was told to come here.

## 2017-07-26 ENCOUNTER — Ambulatory Visit: Payer: Medicaid Other

## 2017-08-04 ENCOUNTER — Other Ambulatory Visit: Payer: Self-pay

## 2017-08-04 DIAGNOSIS — I890 Lymphedema, not elsewhere classified: Secondary | ICD-10-CM

## 2017-08-04 NOTE — Progress Notes (Signed)
Tameka from [facility] called to find out if a referral was put in for pt's Lymph edema.After speaking with Dr. Manus Rudd and Dr. Ilda Basset, a referral was sent to [facility] on Wed March 6th at 2:30pm pt notified.

## 2017-08-10 ENCOUNTER — Ambulatory Visit: Payer: Self-pay | Attending: Family Medicine | Admitting: Rehabilitation

## 2017-08-10 ENCOUNTER — Encounter: Payer: Self-pay | Admitting: Rehabilitation

## 2017-08-10 ENCOUNTER — Other Ambulatory Visit: Payer: Self-pay

## 2017-08-10 DIAGNOSIS — I890 Lymphedema, not elsewhere classified: Secondary | ICD-10-CM | POA: Insufficient documentation

## 2017-08-10 NOTE — Patient Instructions (Signed)
ABC handout, lymphedema diagnosis and POC

## 2017-08-10 NOTE — Therapy (Signed)
[place], [place], [address] Phone: [phone]   Fax:  [phone]  Physical Therapy Evaluation  Patient Details  Name: Whitney Morrison MRN: [id number] Date of Birth: 06/04/1981 Referring Provider: Dannielle Huh, DO   Encounter Date: 08/10/2017  PT End of Session - 08/10/17 1658    Visit Number  1    Number of Visits  4    Date for PT Re-Evaluation  09/07/17    Authorization Type  Medicaid - check [place]    PT Start Time  1430    PT Stop Time  1515    PT Time Calculation (min)  45 min    Activity Tolerance  Patient tolerated treatment well    Behavior During Therapy  [facility] for tasks assessed/performed       Past Medical History:  Diagnosis Date  . Breast cancer ([place])   . Hypertension   . Lymph edema   . Pregnancy induced hypertension    during her 2005 pregnancy and ever since    Past Surgical History:  Procedure Laterality Date  . BREAST SURGERY      There were no vitals filed for this visit.   Subjective Assessment - 08/10/17 1435    Subjective  Pt presents with swelling in the L UE after breast cancer.  She reports that her initial surgery was in May of 2010 in [place].  See below for diagnostics.  She started noticing swelling in the arm around 2012 but did not see anyone about it until 11 in [place] where she was told to purchase an OTC compression sleeve.  She wore this every day for a few months and then she stopped wearing it because it was falling apart and uncomfortable.  She had a baby a month ago with blood pressure issues so she was seen in the hospital; the hospital sent her here regarding the lymphedema.  She reports when they checked the blood pressure 2 days ago it was okay.  She will be returning to [place] 4/5.      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  SLNB 0/2, ALN 1/18, ER/PR positive    Patient  Stated Goals  decrease swelling    Currently in Pain?  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  SLNB 0/2, ALN 1/18, ER/PR positive    Referring Provider  Dannielle Huh, DO    Onset Date/Surgical Date  10/05/08    Hand Dominance  Right    Next MD Visit  unknown    Prior Therapy  no      Precautions   Precaution Comments  lymphedema precautions      Balance Screen   Has the patient fallen in the past 6 months  No    Has the patient had a decrease in activity level because of a fear of falling?   No    Is the patient reluctant to leave their home because of a fear of falling?   No      Home Environment   Living Environment  Private residence    Living Arrangements  Children    Available Help at Discharge  Family  Prior Function   Level of Independence  Independent      Cognition   Overall Cognitive Status  Within Functional Limits for tasks assessed      Observation/Other Assessments   Skin Integrity  post radiation skin changes supraclavicular region, L axilla, and L breast without significant skin adhesions      Coordination   Gross Motor Movements are Fluid and Coordinated  Yes      ROM / Strength   AROM / PROM / Strength  AROM      AROM   Overall AROM Comments  full with with some Upper arm soreness         LYMPHEDEMA/ONCOLOGY QUESTIONNAIRE - 08/10/17 1655      Type   Cancer Type  L side breast cancer invasive, partial ductal carcinoma, partial lobular,  ER >75% positive      Surgeries   Lumpectomy Date  10/05/08    Sentinel Lymph Node Biopsy Date  10/05/08 0/2    Axillary Lymph Node Dissection Date  10/05/08 1/18 positive    Number Lymph Nodes Removed  -- pt thinks 1 paper says 1/18      Date Lymphedema/Swelling Started   Date  06/07/10      Treatment   Active  Chemotherapy Treatment  No    Past Chemotherapy Treatment  Yes    Date  02/05/09    Active Radiation Treatment  No    Past Radiation Treatment  Yes    Date  02/05/09    Body Site  breast and supraclavicular area    Current Hormone Treatment  Yes    Date  -- 2010    Drug Name  Tamoxifen only took x 2 years due to difficulty getting the medicine      What other symptoms do you have   Are you Having Heaviness or Tightness  Yes    Are you having Pain  Yes    Are you having pitting edema  Yes    Body Site  L UE    Is it Hard or Difficult finding clothes that fit  Yes    Do you have infections  No    Stemmer Sign  No      Lymphedema Stage   Stage  STAGE 2 SPONTANEOUSLY IRREVERSIBLE      Lymphedema Assessments   Lymphedema Assessments  Upper extremities      Right Upper Extremity Lymphedema   15 cm Proximal to Olecranon Process  34 cm    10 cm Proximal to Olecranon Process  33.7 cm    Olecranon Process  27.5 cm    15 cm Proximal to Ulnar Styloid Process  26 cm    10 cm Proximal to Ulnar Styloid Process  21.3 cm    Just Proximal to Ulnar Styloid Process  16.5 cm    Across Hand at [employer]  20.4 cm    At [place] of 2nd Digit  6.5 cm      Left Upper Extremity Lymphedema   15 cm Proximal to Olecranon Process  42 cm    10 cm Proximal to Olecranon Process  47 cm    Olecranon Process  44.3 cm    15 cm Proximal to Ulnar Styloid Process  46 cm    10 cm Proximal to Ulnar Styloid Process  31 cm    Just Proximal to Ulnar Styloid Process  19.5 cm    Across Hand at [employer]  21.3 cm  At [facility] of 2nd Digit  6.5 cm        Quick Dash - 08/10/17 0001    Open a tight or new jar  Moderate difficulty    Do heavy household chores (wash walls, wash floors)  Moderate difficulty    Carry a shopping bag or briefcase  Moderate difficulty    Wash your back  No difficulty    Use a knife to cut food  No difficulty    Recreational activities in which you take some force or impact through  your arm, shoulder, or hand (golf, hammering, tennis)  Moderate difficulty    During the past week, to what extent has your arm, shoulder or hand problem interfered with your normal social activities with family, friends, neighbors, or groups?  Modererately    During the past week, to what extent has your arm, shoulder or hand problem limited your work or other regular daily activities  Modererately    Arm, shoulder, or hand pain.  Severe    Tingling (pins and needles) in your arm, shoulder, or hand  Moderate    Difficulty Sleeping  Moderate difficulty    DASH Score  43.18 %       Objective measurements completed on examination: See above findings.      [facility] Adult PT Treatment/Exercise - 08/10/17 0001      Self-Care   Self-Care  -- educated on ABC packet, lymphedema diagnosis      Manual Therapy   Manual Therapy  Edema management    Edema Management  given TG soft small gauntlet for gentle compression (M too large) with education on when to remove             PT Education - 08/10/17 1657    Education provided  Yes    Education Details  ABC handout, lymphedema diagnosis and POC    Person(s) Educated  Patient    Methods  Explanation;Handout    Comprehension  Verbalized understanding      Emailed Dawson Bills regarding options for air travel    PT Long Term Goals - 08/10/17 1710      PT LONG TERM GOAL #1   Title  Pt will be knowledgeable about self MLD and self bandaging techniques to continue home treatment    Time  1    Period  Months    Status  New    Target Date  09/07/17      PT LONG TERM GOAL #2   Title  Pt will be educated on risk reduction practices for lymphedema    Time  1    Period  Months    Status  New    Target Date  09/07/17      PT LONG TERM GOAL #3   Title  pt will obtain/plan for adequate compression garments for plane travel to [place]    Time  1    Period  Months    Status  New    Target Date  09/07/17      PT LONG TERM GOAL #4    Title  pt will decrease measurements at 15cm proximal to the olecranon to 44cm or less    Baseline  47    Time  1    Period  Months    Status  New    Target Date  09/07/17             Plan - 08/10/17 1659    Clinical Impression Statement  Pt presents with worsening of L UE lymphedema of 6-7 years possibly after birth of baby 1 month ago/pregnancy.  Pt with needs for CDT to reduce L UE size and to become independent with self compression and care.      History and Personal Factors relevant to plan of care:  will be leaving the country 09/09/17, language barrier, insurance    Clinical Presentation  Evolving    Clinical Presentation due to:  progression lymphedema    Clinical Decision Making  Moderate    Rehab Potential  Good    Clinical Impairments Affecting Rehab Potential  time of onset, post lymph node removal, radiation, chemotherapy    PT Frequency  -- 3 visits    PT Duration  4 weeks    PT Treatment/Interventions  Therapeutic exercise;Patient/family education;Manual lymph drainage;Compression bandaging;Manual techniques;Passive range of motion    PT Next Visit Plan  begin CDT L UE, self bandaging, self MLD    Consulted and Agree with Plan of Care  Patient       Patient will benefit from skilled therapeutic intervention in order to improve the following deficits and impairments:  Impaired UE functional use, Decreased knowledge of precautions  Visit Diagnosis: Lymphedema, not elsewhere classified - Plan: PT plan of care cert/re-cert     Problem List Patient Active Problem List   Diagnosis Date Noted  . Hypertension 07/18/2017  . Chronic hypertension affecting pregnancy 07/11/2017  . Vaginal delivery 07/11/2017  . Group B streptococcal carriage complicating pregnancy [id number]/4503  . Supervision of high risk pregnancy, antepartum 06/21/2017  . Chronic hypertension during pregnancy, antepartum 06/21/2017  . History of breast cancer 06/21/2017  . History of IUFD 06/21/2017     Stark Bray 08/10/2017, 5:18 PM  [place], [place], [address] Phone: [phone]   Fax:  [phone]  Name: Whitney Morrison MRN: [id number] Date of Birth: 01/19/1981

## 2017-08-17 ENCOUNTER — Ambulatory Visit: Payer: Self-pay | Admitting: Rehabilitation

## 2017-08-24 ENCOUNTER — Encounter: Payer: Self-pay | Admitting: Rehabilitation

## 2017-08-26 ENCOUNTER — Ambulatory Visit (INDEPENDENT_AMBULATORY_CARE_PROVIDER_SITE_OTHER): Payer: Self-pay | Admitting: Student

## 2017-08-26 ENCOUNTER — Encounter: Payer: Self-pay | Admitting: Student

## 2017-08-26 DIAGNOSIS — I10 Essential (primary) hypertension: Secondary | ICD-10-CM

## 2017-08-26 DIAGNOSIS — Z30018 Encounter for initial prescription of other contraceptives: Secondary | ICD-10-CM

## 2017-08-26 MED ORDER — AMLODIPINE BESYLATE 10 MG PO TABS: 10.0000 mg | ORAL_TABLET | Freq: Every day | ORAL | 0 refills | Status: AC

## 2017-08-26 MED ORDER — LABETALOL HCL 200 MG PO TABS: 400.0000 mg | ORAL_TABLET | Freq: Two times a day (BID) | ORAL | 0 refills | Status: AC

## 2017-08-26 MED ORDER — DIAPHRAGM ARC-SPRING VA DPRH: 1.0000 [IU] | VAGINAL_INSERT | VAGINAL | 0 refills | Status: AC | PRN

## 2017-08-26 MED ORDER — IBUPROFEN 800 MG PO TABS: 800.0000 mg | ORAL_TABLET | Freq: Three times a day (TID) | ORAL | 0 refills | Status: AC | PRN

## 2017-08-26 NOTE — Progress Notes (Signed)
Subjective:     Whitney Morrison is a 37 y.o. female who presents for a postpartum visit. She is 6 weeks postpartum following a spontaneous vaginal delivery. I have fully reviewed the prenatal and intrapartum course. The delivery was at 54 gestational weeks. Outcome: spontaneous vaginal delivery. Anesthesia: epidural. Postpartum course has been complicated by MAU evaluation for HTN & lymphadenopathy of her left arm. Is currently on norvasc 10 mg QD & labetalol 200 mg BID. Baby's course has been normal. Baby is feeding by both breast and bottle. Bleeding no bleeding. Bowel function is constipated. Bladder function is normal. Patient is not sexually active. Contraception method is diaphragm. Postpartum depression screening: negative.  The following portions of the patient's history were reviewed and updated as appropriate: allergies, current medications, past family history, past medical history, past social history, past surgical history and problem list.  Review of Systems Pertinent items are noted in HPI.   Objective:    BP (!) 132/99   Pulse 75   Ht 5' 4.96" (1.65 m)   Wt 206 lb 9.6 oz (93.7 kg)   BMI 34.42 kg/m   General:  alert, cooperative and appears stated age  Lungs: clear to auscultation bilaterally  Heart:  regular rate and rhythm, S1, S2 normal, no murmur, click, rub or gallop  Abdomen: soft, non-tender; bowel sounds normal; no masses,  no organomegaly        Assessment:     Normal postpartum exam. Pap smear not done at today's visit.  She is due for pap smear & is self pay. She is moving back to [place] on 4/3. Discussed that she likely would not get into a pap clinic prior to leaving. She has a PCP in [place] (as she had only been in the [place] for the pregnancy). Patient will f/u with her PCP for pap smear & management of her HTN. Requesting 3 month supply of her medications to cover her until she gets settled back home and able to schedule appt with her PCP.   Discussed contraception  options. She would like paragard but can't get today d/t self pay. Will give number for [facility] for possible placement. She would like diaphragm to use until she is able to get paragard.  Plan:     1. Encounter for routine postpartum follow-up   2. Chronic hypertension  - amLODipine (NORVASC) 10 MG tablet; Take 1 tablet (10 mg total) by mouth daily.  Dispense: 90 tablet; Refill: 0 - labetalol (NORMODYNE) 200 MG tablet; Take 2 tablets (400 mg total) by mouth 2 (two) times daily.  Dispense: 120 tablet; Refill: 0  3. Encounter for initial prescription of other contraceptives  - Diaphragm [place]; Place 1 Units vaginally as needed.  Dispense: 1 Units; Refill: 0   Jorje Guild, NP

## 2017-08-26 NOTE — Patient Instructions (Addendum)
Fiber Content in Foods See the following list for the dietary fiber content of some common foods. High-fiber foods High-fiber foods contain 4 grams or more (4g or more) of fiber per serving. They include:  Artichoke (fresh) - 1 medium has 10.3g of fiber.  Baked beans, plain or vegetarian (canned) -  cup has 5.2g of fiber.  Blackberries or raspberries (fresh) -  cup has 4g of fiber.  Bran cereal -  cup has 8.6g of fiber.  Bulgur (cooked) -  cup has 4g of fiber.  Kidney beans (canned) -  cup has 6.8g of fiber.  Lentils (cooked) -  cup has 7.8g of fiber.  Pear (fresh) - 1 medium has 5.1g of fiber.  Peas (frozen) -  cup has 4.4g of fiber.  Pinto beans (canned) -  cup has 5.5g of fiber.  Pinto beans (dried and cooked) -  cup has 7.7g of fiber.  Potato with skin (baked) - 1 medium has 4.4g of fiber.  Quinoa (cooked) -  cup has 5g of fiber.  Soybeans (canned, frozen, or fresh) -  cup has 5.1g of fiber.  Moderate-fiber foods Moderate-fiber foods contain 1-4 grams (1-4g) of fiber per serving. They include:  Almonds - 1 oz. has 3.5g of fiber.  Apple with skin - 1 medium has 3.3g of fiber.  Applesauce, sweetened -  cup has 1.5g of fiber.  Bagel, plain - one 4-inch (10-cm) bagel has 2g of fiber.  Banana - 1 medium has 3.1g of fiber.  Broccoli (cooked) -  cup has 2.5g of fiber.  Carrots (cooked) -  cup has 2.3g of fiber.  Corn (canned or frozen) -  cup has 2.1g of fiber.  Corn tortilla - one 6-inch (15-cm) tortilla has 1.5g of fiber.  Green beans (canned) -  cup has 2g of fiber.  Instant oatmeal -  cup has about 2g of fiber.  Long-grain brown rice (cooked) - 1 cup has 3.5g of fiber.  Macaroni, enriched (cooked) - 1 cup has 2.5g of fiber.  Melon - 1 cup has 1.4g of fiber.  Multigrain cereal -  cup has about 2-4g of fiber.  Orange - 1 small has 3.1g of fiber.  Potatoes, mashed -  cup has 1.6g of fiber.  Raisins - 1/4 cup has 1.6g of  fiber.  Squash -  cup has 2.9g of fiber.  Sunflower seeds -  cup has 1.1g of fiber.  Tomato - 1 medium has 1.5g of fiber.  Vegetable or soy patty - 1 has 3.4g of fiber.  Whole-wheat bread - 1 slice has 2g of fiber.  Whole-wheat spaghetti -  cup has 3.2g of fiber.  Low-fiber foods Low-fiber foods contain less than 1 gram (less than 1g) of fiber per serving. They include:  Egg - 1 large.  Flour tortilla - one 6-inch (15-cm) tortilla.  Fruit juice -  cup.  Lettuce - 1 cup.  Meat, poultry, or fish - 1 oz.  Milk - 1 cup.  Spinach (raw) - 1 cup.  White bread - 1 slice.  White rice -  cup.  Yogurt -  cup.  Actual amounts of fiber in foods may be different depending on processing. Talk with your dietitian about how much fiber you need in your diet. This information is not intended to replace advice given to you by your health care provider. Make sure you discuss any questions you have with your health care provider. Document Released: 10/10/2006 Document Revised: 10/30/2015 Document Reviewed: 07/17/2015 Elsevier Interactive Patient  Education  2018 [employer].   Constipation, Adult Constipation is when a person has fewer bowel movements in a week than normal, has difficulty having a bowel movement, or has stools that are dry, hard, or larger than normal. Constipation may be caused by an underlying condition. It may become worse with age if a person takes certain medicines and does not take in enough fluids. Follow these instructions at home: Eating and drinking   Eat foods that have a lot of fiber, such as fresh fruits and vegetables, whole grains, and beans.  Limit foods that are high in fat, low in fiber, or overly processed, such as french fries, hamburgers, cookies, candies, and soda.  Drink enough fluid to keep your urine clear or pale yellow. General instructions  Exercise regularly or as told by your health care provider.  Go to the restroom when you  have the urge to go. Do not hold it in.  Take over-the-counter and prescription medicines only as told by your health care provider. These include any fiber supplements.  Practice pelvic floor retraining exercises, such as deep breathing while relaxing the lower abdomen and pelvic floor relaxation during bowel movements.  Watch your condition for any changes.  Keep all follow-up visits as told by your health care provider. This is important. Contact a health care provider if:  You have pain that gets worse.  You have a fever.  You do not have a bowel movement after 4 days.  You vomit.  You are not hungry.  You lose weight.  You are bleeding from the anus.  You have thin, pencil-like stools. Get help right away if:  You have a fever and your symptoms suddenly get worse.  You leak stool or have blood in your stool.  Your abdomen is bloated.  You have severe pain in your abdomen.  You feel dizzy or you faint. This information is not intended to replace advice given to you by your health care provider. Make sure you discuss any questions you have with your health care provider. Document Released: 02/20/2004 Document Revised: 12/12/2015 Document Reviewed: 11/12/2015 Elsevier Interactive Patient Education  2018 [employer].

## 2017-08-31 ENCOUNTER — Encounter: Payer: Self-pay | Admitting: Rehabilitation
